# Patient Record
Sex: Female | Born: 1940
Health system: Southern US, Community
[De-identification: ages and names within clinical notes are randomized; demographics above are authoritative.]

## PROBLEM LIST (undated history)

## (undated) DIAGNOSIS — E785 Hyperlipidemia, unspecified: Secondary | ICD-10-CM

## (undated) DIAGNOSIS — R42 Dizziness and giddiness: Secondary | ICD-10-CM

## (undated) DIAGNOSIS — R600 Localized edema: Secondary | ICD-10-CM

## (undated) DIAGNOSIS — J189 Pneumonia, unspecified organism: Secondary | ICD-10-CM

## (undated) DIAGNOSIS — Z90710 Acquired absence of both cervix and uterus: Secondary | ICD-10-CM

## (undated) DIAGNOSIS — R001 Bradycardia, unspecified: Secondary | ICD-10-CM

## (undated) HISTORY — DX: Acquired absence of both cervix and uterus: Z90.710

## (undated) HISTORY — DX: Localized edema: R60.0

## (undated) HISTORY — DX: Bradycardia, unspecified: R00.1

## (undated) HISTORY — DX: Hyperlipidemia, unspecified: E78.5

## (undated) HISTORY — PX: ABDOMINAL HYSTERECTOMY: SHX81

## (undated) HISTORY — DX: Dizziness and giddiness: R42

---

## 1983-09-13 DIAGNOSIS — Z90710 Acquired absence of both cervix and uterus: Secondary | ICD-10-CM

## 1983-09-13 HISTORY — DX: Acquired absence of both cervix and uterus: Z90.710

## 1995-09-13 HISTORY — PX: OTHER SURGICAL HISTORY: SHX169

## 1998-08-30 ENCOUNTER — Emergency Department (HOSPITAL_COMMUNITY): Admission: EM | Admit: 1998-08-30 | Discharge: 1998-08-30 | Payer: Self-pay | Admitting: Emergency Medicine

## 1999-12-15 ENCOUNTER — Other Ambulatory Visit: Admission: RE | Admit: 1999-12-15 | Discharge: 1999-12-15 | Payer: Self-pay | Admitting: Obstetrics and Gynecology

## 2000-02-15 ENCOUNTER — Encounter: Payer: Self-pay | Admitting: Neurosurgery

## 2000-02-15 ENCOUNTER — Encounter: Admission: RE | Admit: 2000-02-15 | Discharge: 2000-02-15 | Payer: Self-pay | Admitting: Neurosurgery

## 2001-02-13 ENCOUNTER — Other Ambulatory Visit: Admission: RE | Admit: 2001-02-13 | Discharge: 2001-02-13 | Payer: Self-pay | Admitting: Obstetrics and Gynecology

## 2002-03-13 ENCOUNTER — Other Ambulatory Visit: Admission: RE | Admit: 2002-03-13 | Discharge: 2002-03-13 | Payer: Self-pay | Admitting: Internal Medicine

## 2007-03-05 ENCOUNTER — Ambulatory Visit: Admission: RE | Admit: 2007-03-05 | Discharge: 2007-03-05 | Payer: Self-pay | Admitting: Family Medicine

## 2009-07-02 ENCOUNTER — Ambulatory Visit (HOSPITAL_COMMUNITY): Admission: RE | Admit: 2009-07-02 | Discharge: 2009-07-02 | Payer: Self-pay | Admitting: Gastroenterology

## 2009-07-03 ENCOUNTER — Ambulatory Visit (HOSPITAL_COMMUNITY): Admission: RE | Admit: 2009-07-03 | Discharge: 2009-07-03 | Payer: Self-pay | Admitting: Gastroenterology

## 2010-02-16 ENCOUNTER — Ambulatory Visit (HOSPITAL_BASED_OUTPATIENT_CLINIC_OR_DEPARTMENT_OTHER): Admission: RE | Admit: 2010-02-16 | Discharge: 2010-02-16 | Payer: Self-pay | Admitting: Urology

## 2010-11-29 LAB — POCT I-STAT, CHEM 8
BUN: 21 mg/dL (ref 6–23)
Calcium, Ion: 1.24 mmol/L (ref 1.12–1.32)
Chloride: 105 mEq/L (ref 96–112)
Creatinine, Ser: 1.2 mg/dL (ref 0.4–1.2)
Glucose, Bld: 80 mg/dL (ref 70–99)
HCT: 40 % (ref 36.0–46.0)
Hemoglobin: 13.6 g/dL (ref 12.0–15.0)
Potassium: 3.4 mEq/L — ABNORMAL LOW (ref 3.5–5.1)
Sodium: 143 mEq/L (ref 135–145)
TCO2: 28 mmol/L (ref 0–100)

## 2011-01-04 ENCOUNTER — Telehealth: Payer: Self-pay | Admitting: Internal Medicine

## 2011-01-04 NOTE — Telephone Encounter (Signed)
Dr Teryl Lucy white office wants to know if you got records for dr bensimhon to review. Pt is waiting for an approval to be seen by dr bensimhon.

## 2011-01-04 NOTE — Telephone Encounter (Signed)
Have sent info to [facility] to sch appt

## 2011-01-05 NOTE — Telephone Encounter (Signed)
Pt is coming in tomorrow 4/26 to see Dr Bensimhon/jml

## 2011-01-06 ENCOUNTER — Encounter: Payer: Self-pay | Admitting: Internal Medicine

## 2011-01-06 ENCOUNTER — Ambulatory Visit (INDEPENDENT_AMBULATORY_CARE_PROVIDER_SITE_OTHER): Payer: Medicare Other | Admitting: Internal Medicine

## 2011-01-06 VITALS — BP 115/66 | HR 47 | Ht 65.0 in | Wt 160.0 lb

## 2011-01-06 DIAGNOSIS — R600 Localized edema: Secondary | ICD-10-CM | POA: Insufficient documentation

## 2011-01-06 DIAGNOSIS — R609 Edema, unspecified: Secondary | ICD-10-CM

## 2011-01-06 DIAGNOSIS — I498 Other specified cardiac arrhythmias: Secondary | ICD-10-CM

## 2011-01-06 DIAGNOSIS — R001 Bradycardia, unspecified: Secondary | ICD-10-CM

## 2011-01-06 DIAGNOSIS — R42 Dizziness and giddiness: Secondary | ICD-10-CM

## 2011-01-06 DIAGNOSIS — E785 Hyperlipidemia, unspecified: Secondary | ICD-10-CM | POA: Insufficient documentation

## 2011-01-06 NOTE — Assessment & Plan Note (Signed)
Likely due to sinus node dysfunction. Currently asymptomatic and with good HR response with walking. Will check echo and stress test which I suspect will be normal. We discussed the patho-physiology of this and I told her she will likely need a pacemaker down the road as this becomes symptomatic but will proceed with watchful for now. Knows to call me with progressive fatigue, presyncope or syncope. Given the fact that resting HR was in 50s last year suspect she will need pacer over the next year or two.

## 2011-01-06 NOTE — Patient Instructions (Signed)
Your physician has requested that you have an exercise tolerance test. For further information please visit [email or web address] Please also follow instruction sheet, as given. Your physician has requested that you have an echocardiogram. Echocardiography is a painless test that uses sound waves to create images of your heart. It provides your doctor with information about the size and shape of your heart and how well your heart's chambers and valves are working. This procedure takes approximately one hour. There are no restrictions for this procedure. Your physician wants you to follow-up in: 6 months. You will receive a reminder letter in the mail two months in advance. If you don't receive a letter, please call our office to schedule the follow-up appointment.

## 2011-01-06 NOTE — Progress Notes (Signed)
HPI:  Ashley Black is a 70 y/o woman (friend of Karen Chafe in our CCU) with a history of hyperlipidemia and LE edema referred by Dr. Laurann Montana for bradycardia.  Denies any h/o known heart disease. Has h/o mild chronic dizziness (greater than 20 years) which she relates to premarin. Never had a stress test or cath.   Does not exercise regularly but is very active without any problems. Says if her fluid builds up she gets SOB with steps but otherwise does great. No CP or pressure. Gets dizzy if she turns her head too fast. But no syncope or presyncope. Says HR in past was in 50s. Recently went to Dr. Cliffton Asters for routine check-up was 42 bpm. Had TSH was normal. Has lost a little weight recently through dieting Feels like her energy is better than most people. No ttaking any supplements or other meds that could cause bradycardia.   No family history of conduction disorders. Mother was obese and had DM2 and suffered from angina.    ROS: All other systems normal except as mentioned in HPI, past medical history and problem list.    No past medical history on file.  Current Outpatient Prescriptions  Medication Sig Dispense Refill  . loratadine (CLARITIN) 10 MG tablet Take 10 mg by mouth as needed.        . meloxicam (MOBIC) 7.5 MG tablet Take 7.5 mg by mouth daily.        . pravastatin (PRAVACHOL) 40 MG tablet Take 40 mg by mouth daily.        Marland Kitchen triamterene-hydrochlorothiazide (MAXZIDE-25) 37.5-25 MG per tablet Take 1 tablet by mouth daily.           Not on File  History   Social History  . Marital Status: Married    Spouse Name: N/A    Number of Children: N/A  . Years of Education: N/A   Occupational History  . Not on file.   Social History Main Topics  . Smoking status: Former Games developer  . Smokeless tobacco: Not on file  . Alcohol Use: Not on file  . Drug Use: Not on file  . Sexually Active: Not on file   Other Topics Concern  . Not on file   Social History Narrative  . No  narrative on file    No family history on file.  PHYSICAL EXAM: Filed Vitals:   01/06/11 1044  BP: 115/66  Pulse: 47   HR quickly 60-70 on slow hall walk. Up to 106 on brisk walk (70% of age-predicted max) General:  Well appearing. No respiratory difficulty. Looks younger than stated age HEENT: normal Neck: supple. no JVD. Carotids 2+ bilat; no bruits. No lymphadenopathy or thryomegaly appreciated. Cor: PMI nondisplaced. Brady,regular. No rubs, gallops or murmurs. Lungs: clear Abdomen: soft, nontender, nondistended. No hepatosplenomegaly. No bruits or masses. Good bowel sounds. Extremities: no cyanosis, clubbing, rash, edema Neuro: alert & oriented x 3, cranial nerves grossly intact. moves all 4 extremities w/o difficulty. Affect pleasant.  ECG:  Sinus brady 43 PR 144 QRS 68ms No ST-T wave abnormalities.    ASSESSMENT & PLAN:

## 2011-01-26 ENCOUNTER — Encounter: Payer: Self-pay | Admitting: Internal Medicine

## 2011-01-31 ENCOUNTER — Ambulatory Visit (HOSPITAL_COMMUNITY): Payer: Medicare Other | Attending: Internal Medicine

## 2011-01-31 ENCOUNTER — Ambulatory Visit (INDEPENDENT_AMBULATORY_CARE_PROVIDER_SITE_OTHER): Payer: Medicare Other | Admitting: Internal Medicine

## 2011-01-31 DIAGNOSIS — M7989 Other specified soft tissue disorders: Secondary | ICD-10-CM | POA: Insufficient documentation

## 2011-01-31 DIAGNOSIS — I379 Nonrheumatic pulmonary valve disorder, unspecified: Secondary | ICD-10-CM | POA: Insufficient documentation

## 2011-01-31 DIAGNOSIS — I495 Sick sinus syndrome: Secondary | ICD-10-CM | POA: Insufficient documentation

## 2011-01-31 DIAGNOSIS — I059 Rheumatic mitral valve disease, unspecified: Secondary | ICD-10-CM | POA: Insufficient documentation

## 2011-01-31 DIAGNOSIS — I498 Other specified cardiac arrhythmias: Secondary | ICD-10-CM

## 2011-01-31 DIAGNOSIS — E785 Hyperlipidemia, unspecified: Secondary | ICD-10-CM | POA: Insufficient documentation

## 2011-01-31 DIAGNOSIS — R001 Bradycardia, unspecified: Secondary | ICD-10-CM

## 2011-01-31 DIAGNOSIS — I079 Rheumatic tricuspid valve disease, unspecified: Secondary | ICD-10-CM | POA: Insufficient documentation

## 2011-01-31 NOTE — Progress Notes (Signed)
Addended by: Meredith Staggers on: 01/31/2011 03:35 PM   Modules accepted: Orders

## 2011-01-31 NOTE — Progress Notes (Signed)
Exercise Treadmill Test  Pre-Exercise Testing Evaluation Rhythm: sinus bradycardia  Rate: 52   PR:  .14 QRS:  .07  QT:  .44 QTc: .42     Test  Exercise Tolerance Test Ordering MD: Arvilla Meres, MD  Interpreting MD:  Arvilla Meres, MD  Unique Test No: 1  Treadmill:  1  Indication for ETT: Bradycardia  Contraindication to ETT: No   Stress Modality: exercise - treadmill  Cardiac Imaging Performed: non   Protocol: standard Bruce - maximal  Max BP:  187/74  Max MPHR (bpm):  149 85% MPR (bpm):  126  MPHR obtained (bpm):150 % MPHR obtained:  100  Reached 85% MPHR (min:sec):  5:58 Total Exercise Time (min-sec):  7:30  Workload in METS:  9.3 Borg Scale: 17  Reason ETT Terminated:  fatigue    ST Segment Analysis At Rest: Normal With Exercise: 1mm ST depression in inferior and lateral leads  Other Information Arrhythmia:  No Angina during ETT:  absent (0) Quality of ETT:  diagnostic  ETT Interpretation:  Normal HR response to exercise. Borderline positive ST depression on ECG with no exertional angina. Possible false + ECG but given family history will need Myoview or cardiac CT to evaluate further.   Comments: Normal HR response to exercise. Borderline positive ST depression on ECG with no exertional angina. Possible false + ECG but given family history will need Myoview or cardiac CT to evaluate further.    Recommendations: As above.

## 2011-02-10 ENCOUNTER — Encounter: Payer: Self-pay | Admitting: Internal Medicine

## 2011-02-14 ENCOUNTER — Other Ambulatory Visit (INDEPENDENT_AMBULATORY_CARE_PROVIDER_SITE_OTHER): Payer: Medicare Other | Admitting: *Deleted

## 2011-02-14 DIAGNOSIS — I498 Other specified cardiac arrhythmias: Secondary | ICD-10-CM

## 2011-02-14 DIAGNOSIS — R001 Bradycardia, unspecified: Secondary | ICD-10-CM

## 2011-02-14 LAB — BASIC METABOLIC PANEL
BUN: 22 mg/dL (ref 6–23)
Chloride: 107 mEq/L (ref 96–112)
GFR: 50.39 mL/min — ABNORMAL LOW (ref >60.00)
Potassium: 3.4 mEq/L — ABNORMAL LOW (ref 3.5–5.1)
Sodium: 141 mEq/L (ref 135–145)

## 2011-02-15 ENCOUNTER — Telehealth: Payer: Self-pay | Admitting: *Deleted

## 2011-02-15 DIAGNOSIS — E876 Hypokalemia: Secondary | ICD-10-CM

## 2011-02-15 NOTE — Telephone Encounter (Signed)
Notes Recorded by Jacqlyn Krauss, RN on 02/15/2011 at 2:08 PM Reviewed with Dr Graciela Husbands. Start KCL 10 mEq daily. Repeat BMP in 2 weeks. LMTCB     LMTCB

## 2011-02-15 NOTE — Telephone Encounter (Signed)
Forwarded to Triage.

## 2011-02-15 NOTE — Telephone Encounter (Signed)
BMP 02/14/11 (see lab results)  K 3.4 --reviewed with Dr Graciela Husbands. He recommended start KCL 10 mEq daily and repeat BMP in 2 weeks. LMTCB

## 2011-02-16 ENCOUNTER — Encounter: Payer: Self-pay | Admitting: *Deleted

## 2011-02-16 MED ORDER — POTASSIUM CHLORIDE ER 10 MEQ PO TBCR: EXTENDED_RELEASE_TABLET | ORAL | Status: DC

## 2011-02-16 NOTE — Telephone Encounter (Signed)
Addended by: Harriet Butte on: 02/16/2011 09:05 AM   Modules accepted: Orders

## 2011-02-16 NOTE — Telephone Encounter (Signed)
Patient aware to take Potassium 10 MEQ one tablet by mouth daily. A prescription send to [facility]. Pt has an appointment to repeat BMET on June 22 Nd 2012.

## 2011-02-17 ENCOUNTER — Telehealth: Payer: Self-pay | Admitting: Cardiology

## 2011-02-17 NOTE — Telephone Encounter (Signed)
Pt calling about her lab results which were reviewed with her.  She was concerned about her kidney function and was notified that the lab work demonstrates normal function.  Instructed her that her potassium was low and that she needed a suppliment to which she responded she was aware and has started that.

## 2011-02-17 NOTE — Telephone Encounter (Signed)
Pt rtn call to [facility] about lft results

## 2011-02-23 ENCOUNTER — Ambulatory Visit (HOSPITAL_COMMUNITY)
Admission: RE | Admit: 2011-02-23 | Discharge: 2011-02-23 | Disposition: A | Discharge status: Home / Self Care | Payer: Medicare Other | Source: Ambulatory Visit | Attending: Internal Medicine | Admitting: Internal Medicine

## 2011-02-23 DIAGNOSIS — I498 Other specified cardiac arrhythmias: Secondary | ICD-10-CM | POA: Insufficient documentation

## 2011-02-23 DIAGNOSIS — R001 Bradycardia, unspecified: Secondary | ICD-10-CM

## 2011-02-23 DIAGNOSIS — K8020 Calculus of gallbladder without cholecystitis without obstruction: Secondary | ICD-10-CM | POA: Insufficient documentation

## 2011-02-23 MED ORDER — IOHEXOL 350 MG/ML SOLN
80.0000 mL | Freq: Once | INTRAVENOUS | Status: AC | PRN
  Administered 2011-02-23: 80 mL via INTRAVENOUS

## 2011-02-24 DIAGNOSIS — R079 Chest pain, unspecified: Secondary | ICD-10-CM

## 2011-02-28 ENCOUNTER — Telehealth: Payer: Self-pay | Admitting: Internal Medicine

## 2011-02-28 NOTE — Telephone Encounter (Signed)
Pt calling re cardiac ct done 6-13 wants results

## 2011-03-01 NOTE — Telephone Encounter (Signed)
Pt given CT results.

## 2011-03-04 ENCOUNTER — Other Ambulatory Visit (INDEPENDENT_AMBULATORY_CARE_PROVIDER_SITE_OTHER): Payer: Medicare Other | Admitting: *Deleted

## 2011-03-04 DIAGNOSIS — E876 Hypokalemia: Secondary | ICD-10-CM

## 2011-03-04 LAB — BASIC METABOLIC PANEL
BUN: 20 mg/dL (ref 6–23)
Chloride: 110 mEq/L (ref 96–112)
GFR: 48.88 mL/min — ABNORMAL LOW (ref >60.00)
Potassium: 3.7 mEq/L (ref 3.5–5.1)
Sodium: 143 mEq/L (ref 135–145)

## 2011-03-07 ENCOUNTER — Telehealth: Payer: Self-pay | Admitting: Internal Medicine

## 2011-03-07 NOTE — Telephone Encounter (Signed)
Pt given lab results 

## 2011-03-07 NOTE — Telephone Encounter (Signed)
Pt rtn call to nurse to get lab results

## 2011-09-21 ENCOUNTER — Other Ambulatory Visit: Payer: Self-pay | Admitting: *Deleted

## 2011-09-21 DIAGNOSIS — E876 Hypokalemia: Secondary | ICD-10-CM

## 2011-09-21 MED ORDER — POTASSIUM CHLORIDE ER 10 MEQ PO TBCR: EXTENDED_RELEASE_TABLET | ORAL | Status: DC

## 2011-12-12 ENCOUNTER — Other Ambulatory Visit: Payer: Self-pay | Admitting: *Deleted

## 2011-12-12 DIAGNOSIS — E876 Hypokalemia: Secondary | ICD-10-CM

## 2011-12-12 MED ORDER — POTASSIUM CHLORIDE ER 10 MEQ PO TBCR: EXTENDED_RELEASE_TABLET | ORAL | Status: DC

## 2012-11-30 ENCOUNTER — Ambulatory Visit
Admission: RE | Admit: 2012-11-30 | Discharge: 2012-11-30 | Disposition: A | Discharge status: Home / Self Care | Payer: Medicare Other | Source: Ambulatory Visit | Attending: Family Medicine | Admitting: Family Medicine

## 2012-11-30 ENCOUNTER — Other Ambulatory Visit: Payer: Self-pay | Admitting: Family Medicine

## 2012-11-30 DIAGNOSIS — J189 Pneumonia, unspecified organism: Secondary | ICD-10-CM

## 2015-02-20 ENCOUNTER — Other Ambulatory Visit: Payer: Self-pay | Admitting: Family Medicine

## 2015-02-20 ENCOUNTER — Ambulatory Visit
Admission: RE | Admit: 2015-02-20 | Discharge: 2015-02-20 | Disposition: A | Discharge status: Home / Self Care | Payer: Medicare Other | Source: Ambulatory Visit | Attending: Family Medicine | Admitting: Family Medicine

## 2015-02-20 DIAGNOSIS — J189 Pneumonia, unspecified organism: Secondary | ICD-10-CM

## 2016-05-13 ENCOUNTER — Other Ambulatory Visit: Payer: Self-pay | Admitting: Gastroenterology

## 2016-06-17 ENCOUNTER — Encounter (HOSPITAL_COMMUNITY): Payer: Self-pay | Admitting: *Deleted

## 2016-06-21 ENCOUNTER — Ambulatory Visit (HOSPITAL_COMMUNITY): Payer: Medicare Other | Admitting: Anesthesiology

## 2016-06-21 ENCOUNTER — Encounter (HOSPITAL_COMMUNITY)
Admission: RE | Disposition: A | Discharge status: Home / Self Care | Payer: Self-pay | Source: Ambulatory Visit | Attending: Gastroenterology

## 2016-06-21 ENCOUNTER — Encounter (HOSPITAL_COMMUNITY): Payer: Self-pay

## 2016-06-21 ENCOUNTER — Ambulatory Visit (HOSPITAL_COMMUNITY)
Admission: RE | Admit: 2016-06-21 | Discharge: 2016-06-21 | Disposition: A | Discharge status: Home / Self Care | Payer: Medicare Other | Source: Ambulatory Visit | Attending: Gastroenterology | Admitting: Gastroenterology

## 2016-06-21 DIAGNOSIS — E7800 Pure hypercholesterolemia, unspecified: Secondary | ICD-10-CM | POA: Insufficient documentation

## 2016-06-21 DIAGNOSIS — D128 Benign neoplasm of rectum: Secondary | ICD-10-CM | POA: Insufficient documentation

## 2016-06-21 DIAGNOSIS — Z1211 Encounter for screening for malignant neoplasm of colon: Secondary | ICD-10-CM | POA: Diagnosis present

## 2016-06-21 DIAGNOSIS — Z86010 Personal history of colonic polyps: Secondary | ICD-10-CM | POA: Insufficient documentation

## 2016-06-21 DIAGNOSIS — Z87891 Personal history of nicotine dependence: Secondary | ICD-10-CM | POA: Diagnosis not present

## 2016-06-21 DIAGNOSIS — N183 Chronic kidney disease, stage 3 (moderate): Secondary | ICD-10-CM | POA: Insufficient documentation

## 2016-06-21 HISTORY — PX: FLEXIBLE SIGMOIDOSCOPY: SHX5431

## 2016-06-21 HISTORY — DX: Pneumonia, unspecified organism: J18.9

## 2016-06-21 SURGERY — SIGMOIDOSCOPY, FLEXIBLE
Anesthesia: Monitor Anesthesia Care

## 2016-06-21 MED ORDER — PROPOFOL 10 MG/ML IV BOLUS
INTRAVENOUS | Status: DC | PRN
  Administered 2016-06-21 (×3): 10 mg via INTRAVENOUS
  Administered 2016-06-21 (×2): 20 mg via INTRAVENOUS

## 2016-06-21 MED ORDER — ONDANSETRON HCL 4 MG/2ML IJ SOLN
INTRAMUSCULAR | Status: DC | PRN
  Administered 2016-06-21: 4 mg via INTRAVENOUS

## 2016-06-21 MED ORDER — PROPOFOL 10 MG/ML IV BOLUS
INTRAVENOUS | Status: AC
  Filled 2016-06-21: qty 40

## 2016-06-21 MED ORDER — PROPOFOL 500 MG/50ML IV EMUL
INTRAVENOUS | Status: DC | PRN
  Administered 2016-06-21: 200 ug/kg/min via INTRAVENOUS

## 2016-06-21 MED ORDER — LACTATED RINGERS IV SOLN
INTRAVENOUS | Status: DC
  Administered 2016-06-21: 13:00:00 via INTRAVENOUS

## 2016-06-21 MED ORDER — SODIUM CHLORIDE 0.9 % IV SOLN: INTRAVENOUS | Status: DC

## 2016-06-21 MED ORDER — ONDANSETRON HCL 4 MG/2ML IJ SOLN
INTRAMUSCULAR | Status: AC
  Filled 2016-06-21: qty 2

## 2016-06-21 MED ORDER — PROPOFOL 10 MG/ML IV BOLUS
INTRAVENOUS | Status: AC
  Filled 2016-06-21: qty 20

## 2016-06-21 SURGICAL SUPPLY — 22 items

## 2016-06-21 NOTE — Anesthesia Preprocedure Evaluation (Addendum)
Anesthesia Evaluation  Patient identified by MRN, date of birth, ID band Patient awake    Reviewed: Allergy & Precautions, NPO status , Patient's Chart, lab work & pertinent test results  Airway Mallampati: II  TM Distance: >3 FB Neck ROM: Full    Dental   Bridgework:   Pulmonary pneumonia, resolved, former smoker,    Pulmonary exam normal breath sounds clear to auscultation       Cardiovascular negative cardio ROS Normal cardiovascular exam Rhythm:Regular Rate:Normal     Neuro/Psych negative neurological ROS  negative psych ROS   GI/Hepatic negative GI ROS, Neg liver ROS,   Endo/Other  negative endocrine ROS  Renal/GU negative Renal ROS  negative genitourinary   Musculoskeletal negative musculoskeletal ROS (+)   Abdominal   Peds negative pediatric ROS (+)  Hematology negative hematology ROS (+)   Anesthesia Other Findings   Reproductive/Obstetrics negative OB ROS                            Anesthesia Physical Anesthesia Plan  ASA: II  Anesthesia Plan: MAC   Post-op Pain Management:    Induction: Intravenous  Airway Management Planned: Natural Airway  Additional Equipment:   Intra-op Plan:   Post-operative Plan:   Informed Consent: I have reviewed the patients History and Physical, chart, labs and discussed the procedure including the risks, benefits and alternatives for the proposed anesthesia with the patient or authorized representative who has indicated his/her understanding and acceptance.     Plan Discussed with: CRNA  Anesthesia Plan Comments:         Anesthesia Quick Evaluation

## 2016-06-21 NOTE — Anesthesia Postprocedure Evaluation (Signed)
Anesthesia Post Note  Patient: Ashley Black  Procedure(s) Performed: Procedure(s) (LRB): FLEXIBLE SIGMOIDOSCOPY (N/A)  Patient location during evaluation: PACU Anesthesia Type: MAC Level of consciousness: awake and alert Pain management: pain level controlled Vital Signs Assessment: post-procedure vital signs reviewed and stable Respiratory status: spontaneous breathing, nonlabored ventilation, respiratory function stable and patient connected to nasal cannula oxygen Cardiovascular status: stable and blood pressure returned to baseline Anesthetic complications: no    Last Vitals:  Vitals:   06/21/16 1221 06/21/16 1402  BP: (!) 133/56 (!) 146/65  Pulse:  75  Resp:  (!) 24  Temp: 36.8 C 36.4 C    Last Pain:  Vitals:   06/21/16 1402  TempSrc: Oral                 Tyric Rodeheaver J

## 2016-06-21 NOTE — Transfer of Care (Signed)
Immediate Anesthesia Transfer of Care Note  Patient: Ashley Black  Procedure(s) Performed: Procedure(s): FLEXIBLE SIGMOIDOSCOPY (N/A)  Patient Location: PACU  Anesthesia Type:MAC  Level of Consciousness:  sedated, patient cooperative and responds to stimulation  Airway & Oxygen Therapy:Patient Spontanous Breathing and Patient connected to face mask oxgen  Post-op Assessment:  Report given to PACU RN and Post -op Vital signs reviewed and stable  Post vital signs:  Reviewed and stable  Last Vitals:  Vitals:   06/21/16 1221  BP: (!) 133/56  Temp: [id number] C    Complications: No apparent anesthesia complications

## 2016-06-21 NOTE — Op Note (Signed)
[facility] Patient Name: Ashley Black Procedure Date: 06/21/2016 MRN: [id number] Attending MD: Garlan Fair , MD Date of Birth: 1941/03/30 CSN: [id number] Age: 75 Admit Type: Outpatient Procedure:                Colonoscopy Indications:              High risk colon cancer surveillance: Personal                            history of adenoma with villous component Providers:                Garlan Fair, MD, Cleda Daub, RN, William Dalton, Technician Referring MD:              Medicines:                Propofol per Anesthesia Complications:            No immediate complications. Estimated Blood Loss:     Estimated blood loss: none. Procedure:                Pre-Anesthesia Assessment:                           - Prior to the procedure, a History and Physical                            was performed, and patient medications and                            allergies were reviewed. The patient's tolerance of                            previous anesthesia was also reviewed. The risks                            and benefits of the procedure and the sedation                            options and risks were discussed with the patient.                            All questions were answered, and informed consent                            was obtained. Prior Anticoagulants: The patient has                            taken no previous anticoagulant or antiplatelet                            agents. ASA Grade Assessment: II - A patient with  mild systemic disease. After reviewing the risks                            and benefits, the patient was deemed in                            satisfactory condition to undergo the procedure.                           After obtaining informed consent, the colonoscope                            was passed under direct vision. Throughout the                            procedure,  the patient's blood pressure, pulse, and                            oxygen saturations were monitored continuously. The                            [id number]) scope was introduced through                            the anus and advanced to the the descending colon.                            The colonoscopy was technically difficult and                            complex due to restricted mobility of the colon.                            The patient tolerated the procedure well. The                            quality of the bowel preparation was good. No                            anatomical landmarks were photographed. Findings:      The perianal and digital rectal examinations were normal.      A 10 mm polyp was found in the rectum. The polyp was sessile. The polyp       was removed with a hot snare. Resection and retrieval were complete.      The exam was otherwise without abnormality. Impression:               - One 10 mm polyp in the rectum, removed with a hot                            snare. Resected and retrieved.                           - The examination was otherwise normal. Moderate Sedation:  N/A- Per Anesthesia Care Recommendation:           - Patient has a contact number available for                            emergencies. The signs and symptoms of potential                            delayed complications were discussed with the                            patient. Return to normal activities tomorrow.                            Written discharge instructions were provided to the                            patient.                           - Return to primary care physician PRN. Schedule CT                            colonography.                           - Resume previous diet.                           - Continue present medications.                           - Perform a virtual colonoscopy at appointment to                            be  scheduled. Procedure Code(s):        --- Professional ---                           [phone], 52, Colonoscopy, flexible; with removal of                            tumor(s), polyp(s), or other lesion(s) by snare                            technique Diagnosis Code(s):        --- Professional ---                           Z86.010, Personal history of colonic polyps                           K62.1, Rectal polyp CPT copyright 2016 [employer]. All rights reserved. The codes documented in this report are preliminary and upon coder review may  be revised to meet current compliance requirements. Earle Gell, MD Garlan Fair, MD 06/21/2016 2:03:47 PM This report has been signed electronically. Number of  Addenda: 0

## 2016-06-21 NOTE — H&P (Signed)
Procedure: Surveillance colonoscopy. 06/28/2006 colonoscopy was performed with removal of a 1 cm tubulovillous adenomatous rectal polyp. In 2010, normal incomplete surveillance colonoscopy followed by air contrast barium enema were performed.  History: The patient is a 75 year old female born [birth year]. She is scheduled to undergo a surveillance colonoscopy today.  Past medical history: Hypercholesterolemia. Interstitial cystitis. Stage III chronic kidney disease. Sinus node dysfunction leading to bradycardia. Hysterectomy. Anterior cervical spine fusion. D&C. Right eye cataract surgery.  Exam: The patient is alert and lying comfortably on the endoscopy stretcher. Abdomen is soft and nontender to palpation. Lungs are clear to auscultation. Cardiac exam reveals a regular rhythm.  Plan: Proceed with surveillance colonoscopy

## 2016-06-21 NOTE — Discharge Instructions (Signed)

## 2016-06-22 ENCOUNTER — Encounter (HOSPITAL_COMMUNITY): Payer: Self-pay | Admitting: Gastroenterology

## 2016-08-15 ENCOUNTER — Other Ambulatory Visit: Payer: Self-pay | Admitting: Gastroenterology

## 2016-08-15 DIAGNOSIS — Z1211 Encounter for screening for malignant neoplasm of colon: Secondary | ICD-10-CM

## 2016-09-23 ENCOUNTER — Ambulatory Visit
Admission: RE | Admit: 2016-09-23 | Discharge: 2016-09-23 | Disposition: A | Discharge status: Home / Self Care | Payer: Medicare Other | Source: Ambulatory Visit | Attending: Gastroenterology | Admitting: Gastroenterology

## 2016-09-23 DIAGNOSIS — Z1211 Encounter for screening for malignant neoplasm of colon: Secondary | ICD-10-CM

## 2017-02-22 ENCOUNTER — Other Ambulatory Visit: Payer: Self-pay | Admitting: Family Medicine

## 2017-02-22 DIAGNOSIS — R1011 Right upper quadrant pain: Secondary | ICD-10-CM

## 2017-02-23 ENCOUNTER — Ambulatory Visit
Admission: RE | Admit: 2017-02-23 | Discharge: 2017-02-23 | Disposition: A | Discharge status: Home / Self Care | Payer: Medicare Other | Source: Ambulatory Visit | Attending: Family Medicine | Admitting: Family Medicine

## 2017-02-23 DIAGNOSIS — R1011 Right upper quadrant pain: Secondary | ICD-10-CM

## 2017-06-08 ENCOUNTER — Other Ambulatory Visit: Payer: Self-pay | Admitting: Family Medicine

## 2017-06-08 DIAGNOSIS — H93A3 Pulsatile tinnitus, bilateral: Secondary | ICD-10-CM

## 2017-06-12 ENCOUNTER — Ambulatory Visit
Admission: RE | Admit: 2017-06-12 | Discharge: 2017-06-12 | Disposition: A | Discharge status: Home / Self Care | Payer: Medicare Other | Source: Ambulatory Visit | Attending: Family Medicine | Admitting: Family Medicine

## 2017-06-12 DIAGNOSIS — H93A3 Pulsatile tinnitus, bilateral: Secondary | ICD-10-CM

## 2018-08-16 IMAGING — US US CAROTID DUPLEX BILAT
1 series · 13 of 24 positions shown · non-contrast
Comparison: None.

CLINICAL DATA: Bilateral pulsatile tinnitus for 6 months.

EXAM:
BILATERAL CAROTID DUPLEX ULTRASOUND
TECHNIQUE: Gray scale imaging, color Doppler and duplex ultrasound were
performed of bilateral carotid and vertebral arteries in the neck.

[Series 1: us carotid duplex bilat · 0.06mm/px · 13 of 45 slices shown]
[im 1/45]
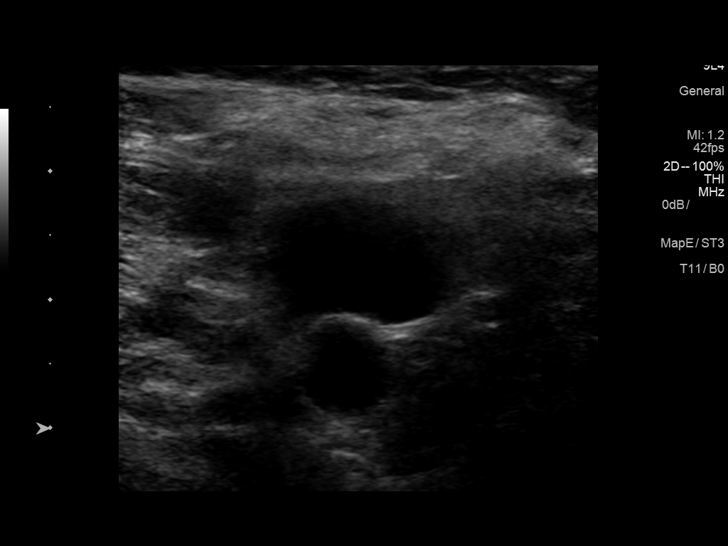
[im 4/45]
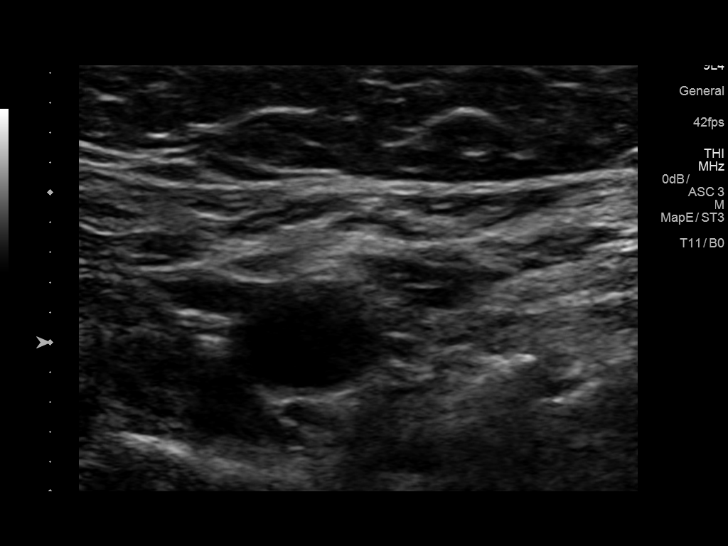
[im 8/45]
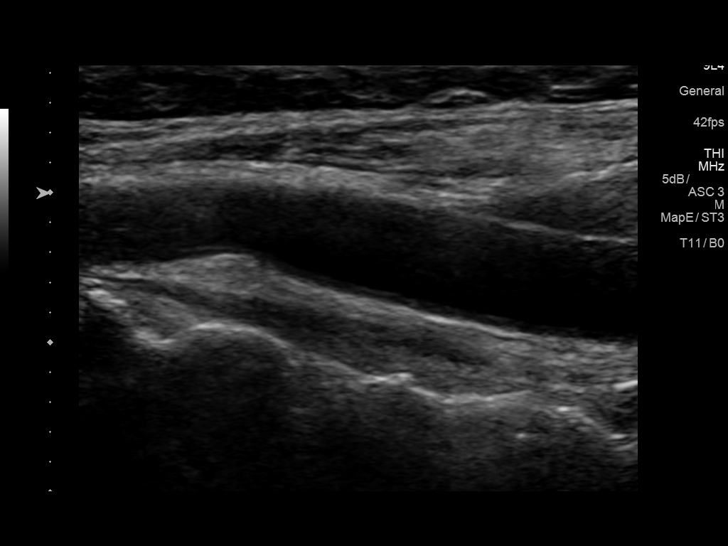
[im 12/45]
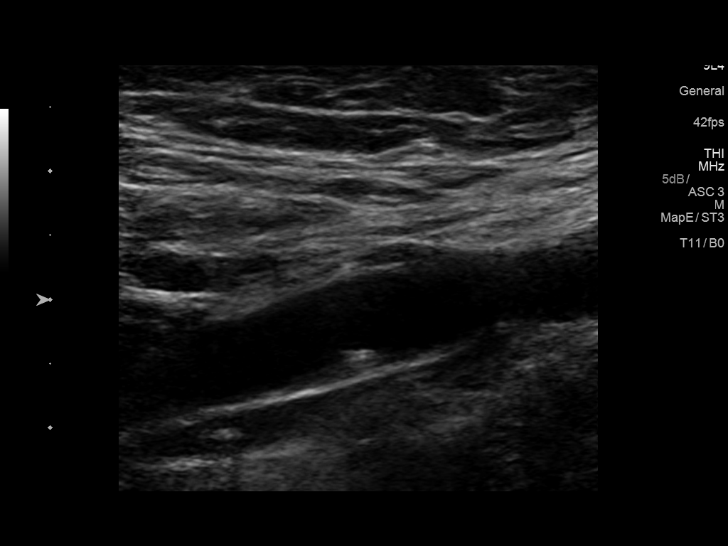
[im 16/45]
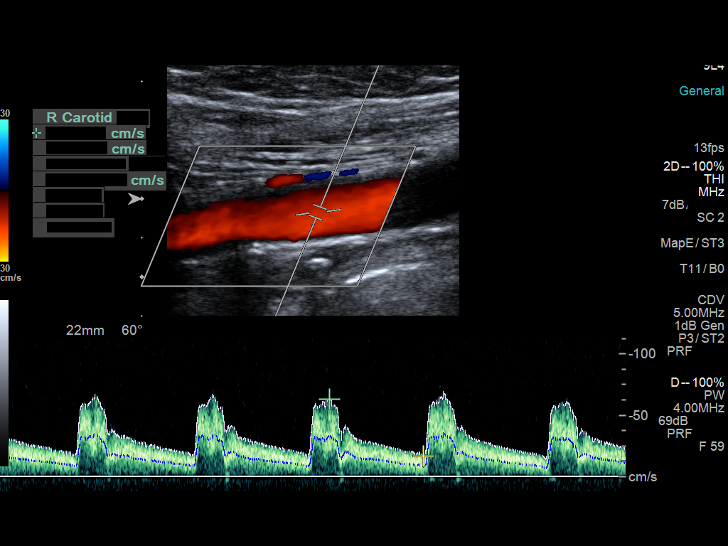
[im 20/45]
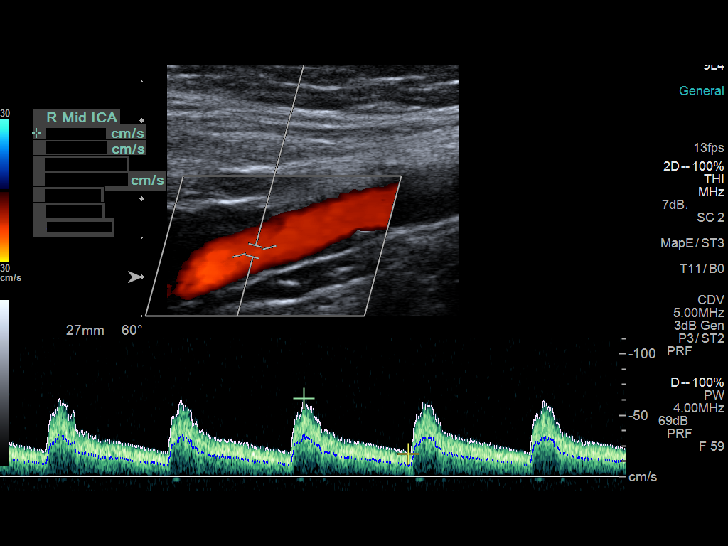
[im 23/45]
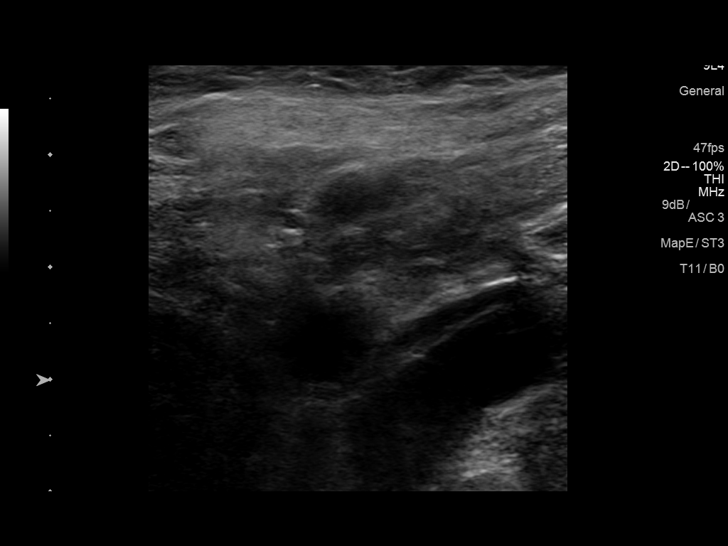
[im 25/45]
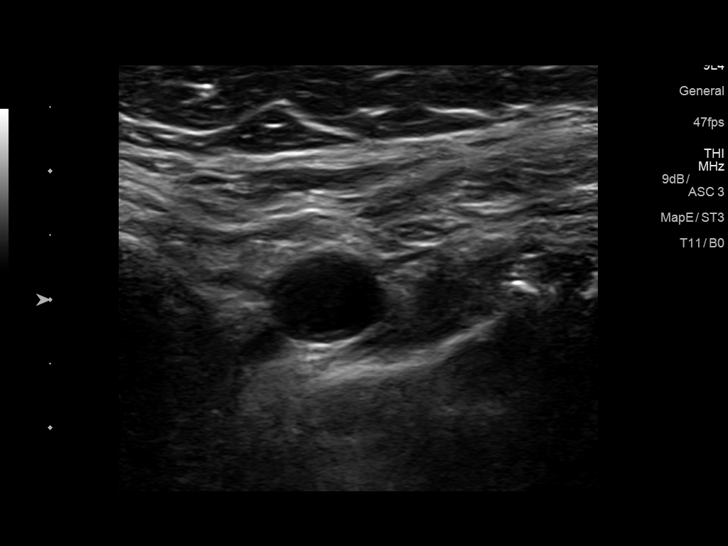
[im 29/45]
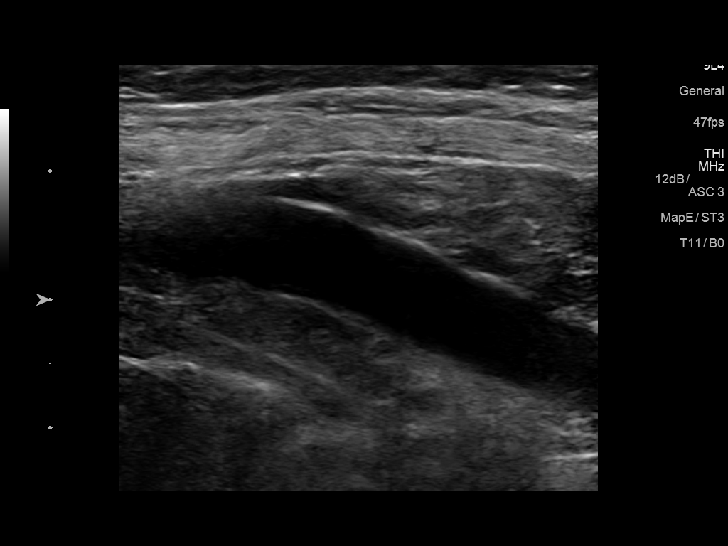
[im 33/45]
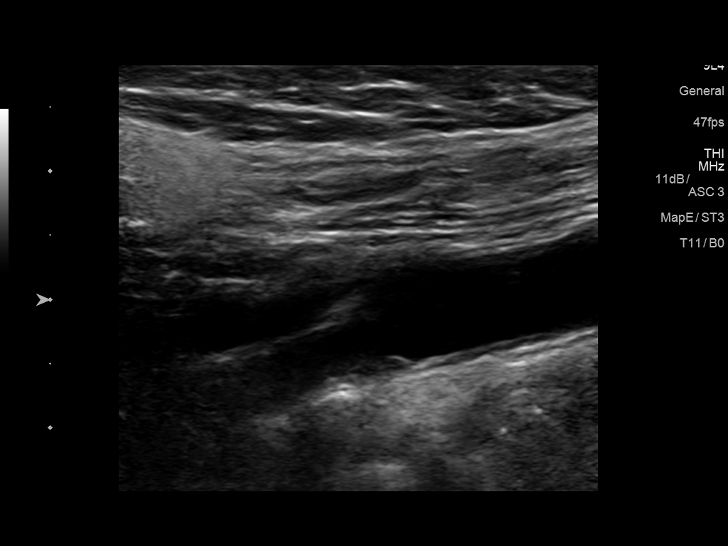
[im 37/45]
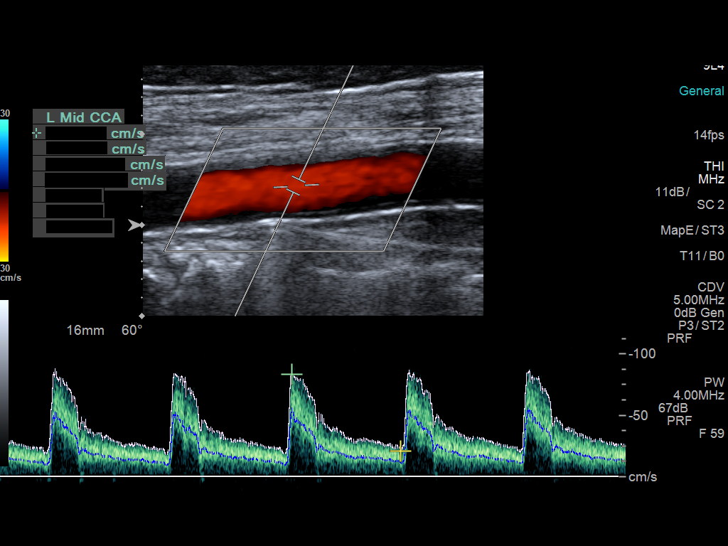
[im 41/45]
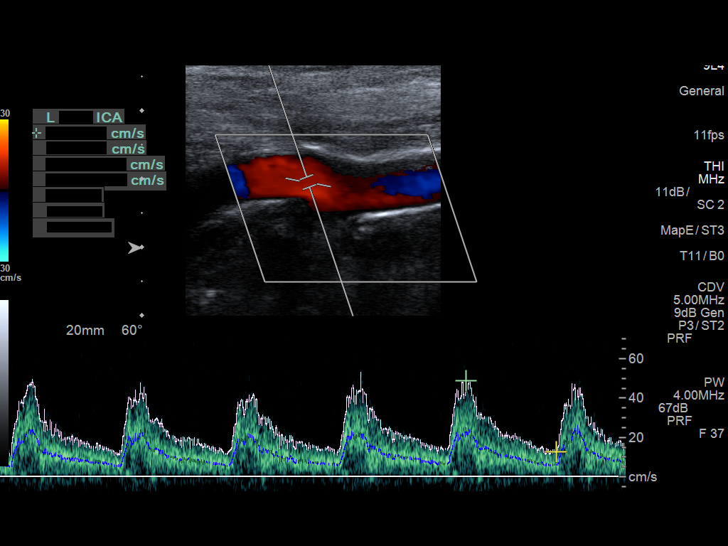
[im 45/45]
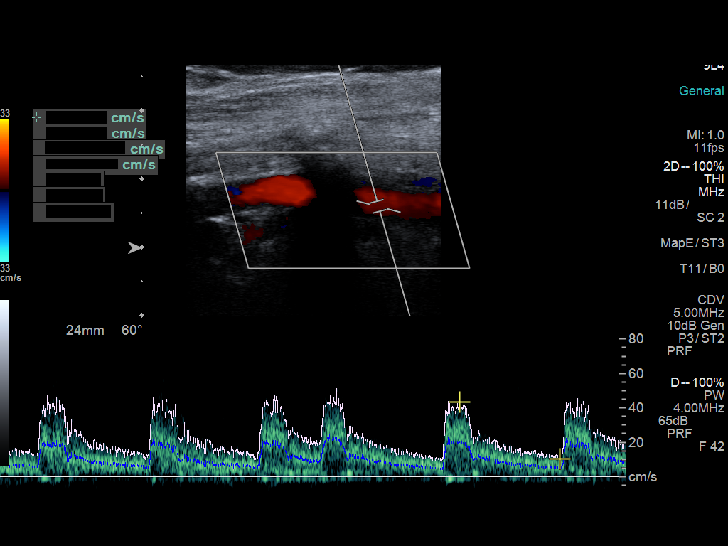

[13 of 24 positions shown; findings below may reference images not displayed]

FINDINGS: Criteria: Quantification of carotid stenosis is based on velocity
parameters that correlate the residual internal carotid diameter
with NASCET-based stenosis levels, using the diameter of the distal
internal carotid lumen as the denominator for stenosis measurement.

The following velocity measurements were obtained:

RIGHT

ICA:  64 cm/sec

CCA:  131 cm/sec

SYSTOLIC ICA/CCA RATIO:

DIASTOLIC ICA/CCA RATIO:

ECA:  65 cm/sec

LEFT

ICA:  66 cm/sec

CCA:  83 cm/sec

SYSTOLIC ICA/CCA RATIO:

DIASTOLIC ICA/CCA RATIO:

ECA:  46 cm/sec

RIGHT CAROTID ARTERY: Small amount of plaque at the right carotid
bulb. External carotid artery is patent with normal waveform. Normal
waveforms and velocities in the internal carotid artery.

RIGHT VERTEBRAL ARTERY: Antegrade flow and normal waveform in the
right vertebral artery.

LEFT CAROTID ARTERY: Small amount of plaque at the left carotid bulb
and proximal internal carotid artery. External carotid artery is
patent with normal waveform. Normal waveforms and velocities in the
internal carotid artery.

LEFT VERTEBRAL ARTERY: Antegrade flow and normal waveform in the
left vertebral artery.
IMPRESSION: Mild atherosclerotic disease in bilateral carotid arteries.
Estimated degree of stenosis in the internal carotid arteries is
less than 50% bilaterally.

Patent vertebral arteries with antegrade flow.

## 2020-01-24 ENCOUNTER — Encounter: Payer: Self-pay | Admitting: Neurology

## 2020-01-31 ENCOUNTER — Encounter: Payer: Self-pay | Admitting: Neurology

## 2020-04-20 ENCOUNTER — Other Ambulatory Visit: Payer: Self-pay

## 2020-04-20 ENCOUNTER — Encounter (HOSPITAL_COMMUNITY): Payer: Self-pay

## 2020-04-20 ENCOUNTER — Inpatient Hospital Stay (HOSPITAL_COMMUNITY)
Admission: EM | Admit: 2020-04-20 | Discharge: 2020-04-25 | DRG: 178 | Disposition: A | Discharge status: Home Health | Payer: Medicare Other | Attending: Internal Medicine | Admitting: Internal Medicine

## 2020-04-20 ENCOUNTER — Emergency Department (HOSPITAL_COMMUNITY): Payer: Medicare Other

## 2020-04-20 DIAGNOSIS — U071 COVID-19: Principal | ICD-10-CM | POA: Diagnosis present

## 2020-04-20 DIAGNOSIS — Z87891 Personal history of nicotine dependence: Secondary | ICD-10-CM

## 2020-04-20 DIAGNOSIS — E876 Hypokalemia: Secondary | ICD-10-CM | POA: Diagnosis not present

## 2020-04-20 DIAGNOSIS — R001 Bradycardia, unspecified: Secondary | ICD-10-CM | POA: Diagnosis present

## 2020-04-20 DIAGNOSIS — E785 Hyperlipidemia, unspecified: Secondary | ICD-10-CM | POA: Diagnosis present

## 2020-04-20 DIAGNOSIS — Z79899 Other long term (current) drug therapy: Secondary | ICD-10-CM

## 2020-04-20 DIAGNOSIS — E872 Acidosis: Secondary | ICD-10-CM | POA: Diagnosis present

## 2020-04-20 DIAGNOSIS — I10 Essential (primary) hypertension: Secondary | ICD-10-CM | POA: Diagnosis present

## 2020-04-20 DIAGNOSIS — N390 Urinary tract infection, site not specified: Secondary | ICD-10-CM | POA: Diagnosis present

## 2020-04-20 DIAGNOSIS — E860 Dehydration: Secondary | ICD-10-CM | POA: Diagnosis present

## 2020-04-20 DIAGNOSIS — Z8249 Family history of ischemic heart disease and other diseases of the circulatory system: Secondary | ICD-10-CM

## 2020-04-20 DIAGNOSIS — Z90710 Acquired absence of both cervix and uterus: Secondary | ICD-10-CM

## 2020-04-20 DIAGNOSIS — I9589 Other hypotension: Secondary | ICD-10-CM

## 2020-04-20 DIAGNOSIS — N3000 Acute cystitis without hematuria: Secondary | ICD-10-CM

## 2020-04-20 DIAGNOSIS — A0839 Other viral enteritis: Secondary | ICD-10-CM | POA: Diagnosis present

## 2020-04-20 DIAGNOSIS — E861 Hypovolemia: Secondary | ICD-10-CM

## 2020-04-20 LAB — CBC
HCT: 41.5 % (ref 36.0–46.0)
Hemoglobin: 14 g/dL (ref 12.0–15.0)
MCH: 28.1 pg (ref 26.0–34.0)
MCHC: 33.7 g/dL (ref 30.0–36.0)
MCV: 83.2 fL (ref 80.0–100.0)
Platelets: 148 10*3/uL — ABNORMAL LOW (ref 150–400)
RBC: 4.99 MIL/uL (ref 3.87–5.11)
RDW: 11.9 % (ref 11.5–15.5)
WBC: 3.8 10*3/uL — ABNORMAL LOW (ref 4.0–10.5)
nRBC: 0 % (ref 0.0–0.2)

## 2020-04-20 LAB — BASIC METABOLIC PANEL
Anion gap: 12 (ref 5–15)
BUN: 11 mg/dL (ref 8–23)
CO2: 21 mmol/L — ABNORMAL LOW (ref 22–32)
Calcium: 8.5 mg/dL — ABNORMAL LOW (ref 8.9–10.3)
Chloride: 101 mmol/L (ref 98–111)
Creatinine, Ser: 0.92 mg/dL (ref 0.44–1.00)
GFR calc Af Amer: >60 mL/min (ref >60)
GFR calc non Af Amer: 59 mL/min — ABNORMAL LOW (ref >60)
Glucose, Bld: 107 mg/dL — ABNORMAL HIGH (ref 70–99)
Potassium: 2.6 mmol/L — CL (ref 3.5–5.1)
Sodium: 134 mmol/L — ABNORMAL LOW (ref 135–145)

## 2020-04-20 LAB — URINALYSIS, ROUTINE W REFLEX MICROSCOPIC
Bilirubin Urine: NEGATIVE
Glucose, UA: NEGATIVE mg/dL
Ketones, ur: NEGATIVE mg/dL
Nitrite: POSITIVE — AB
Protein, ur: NEGATIVE mg/dL
Specific Gravity, Urine: 1.011 (ref 1.005–1.030)
pH: 5 (ref 5.0–8.0)

## 2020-04-20 LAB — HEPATIC FUNCTION PANEL
ALT: 19 U/L (ref 0–44)
AST: 28 U/L (ref 15–41)
Albumin: 3.6 g/dL (ref 3.5–5.0)
Alkaline Phosphatase: 66 U/L (ref 38–126)
Bilirubin, Direct: 0.3 mg/dL — ABNORMAL HIGH (ref 0.0–0.2)
Indirect Bilirubin: 0.8 mg/dL (ref 0.3–0.9)
Total Bilirubin: 1.1 mg/dL (ref 0.3–1.2)
Total Protein: 7.2 g/dL (ref 6.5–8.1)

## 2020-04-20 LAB — SARS CORONAVIRUS 2 BY RT PCR (HOSPITAL ORDER, PERFORMED IN ~~LOC~~ HOSPITAL LAB): SARS Coronavirus 2: POSITIVE — AB

## 2020-04-20 LAB — MAGNESIUM: Magnesium: 1.8 mg/dL (ref 1.7–2.4)

## 2020-04-20 LAB — LACTATE DEHYDROGENASE: LDH: 247 U/L — ABNORMAL HIGH (ref 98–192)

## 2020-04-20 LAB — FIBRINOGEN: Fibrinogen: 504 mg/dL — ABNORMAL HIGH (ref 210–475)

## 2020-04-20 LAB — LACTIC ACID, PLASMA: Lactic Acid, Venous: 2.2 mmol/L (ref 0.5–1.9)

## 2020-04-20 LAB — TROPONIN I (HIGH SENSITIVITY): Troponin I (High Sensitivity): 23 ng/L — ABNORMAL HIGH (ref <18)

## 2020-04-20 LAB — D-DIMER, QUANTITATIVE: D-Dimer, Quant: 0.47 ug/mL-FEU (ref 0.00–0.50)

## 2020-04-20 LAB — C-REACTIVE PROTEIN: CRP: 1.7 mg/dL — ABNORMAL HIGH (ref <1.0)

## 2020-04-20 LAB — PROCALCITONIN: Procalcitonin: 1.48 ng/mL

## 2020-04-20 LAB — TRIGLYCERIDES: Triglycerides: 146 mg/dL (ref <150)

## 2020-04-20 LAB — FERRITIN: Ferritin: 290 ng/mL (ref 11–307)

## 2020-04-20 MED ORDER — ONDANSETRON HCL 4 MG PO TABS: 4.0000 mg | ORAL_TABLET | Freq: Four times a day (QID) | ORAL | Status: DC | PRN

## 2020-04-20 MED ORDER — ACETAMINOPHEN 325 MG PO TABS: 650.0000 mg | ORAL_TABLET | Freq: Four times a day (QID) | ORAL | Status: DC | PRN

## 2020-04-20 MED ORDER — SODIUM CHLORIDE 0.9 % IV SOLN: INTRAVENOUS | Status: DC

## 2020-04-20 MED ORDER — SODIUM CHLORIDE 0.9 % IV SOLN
1.0000 g | INTRAVENOUS | Status: AC
  Administered 2020-04-21 – 2020-04-22 (×2): 1 g via INTRAVENOUS
  Filled 2020-04-20 (×2): qty 10

## 2020-04-20 MED ORDER — POTASSIUM CHLORIDE CRYS ER 20 MEQ PO TBCR
40.0000 meq | EXTENDED_RELEASE_TABLET | Freq: Once | ORAL | Status: AC
Start: 2020-04-20 — End: 2020-04-20
  Administered 2020-04-20: 40 meq via ORAL
  Filled 2020-04-20: qty 2

## 2020-04-20 MED ORDER — GUAIFENESIN-DM 100-10 MG/5ML PO SYRP: 10.0000 mL | ORAL_SOLUTION | ORAL | Status: DC | PRN

## 2020-04-20 MED ORDER — ENOXAPARIN SODIUM 40 MG/0.4ML ~~LOC~~ SOLN
40.0000 mg | SUBCUTANEOUS | Status: DC
  Administered 2020-04-21 – 2020-04-24 (×5): 40 mg via SUBCUTANEOUS
  Filled 2020-04-20 (×5): qty 0.4

## 2020-04-20 MED ORDER — SODIUM CHLORIDE 0.9 % IV SOLN
100.0000 mg | Freq: Every day | INTRAVENOUS | Status: AC
  Administered 2020-04-21 – 2020-04-24 (×4): 100 mg via INTRAVENOUS
  Filled 2020-04-20 (×4): qty 20

## 2020-04-20 MED ORDER — SODIUM CHLORIDE 0.9 % IV SOLN
1.0000 g | Freq: Once | INTRAVENOUS | Status: AC
  Administered 2020-04-20: 1 g via INTRAVENOUS
  Filled 2020-04-20: qty 10

## 2020-04-20 MED ORDER — SODIUM CHLORIDE 0.9% FLUSH
3.0000 mL | Freq: Two times a day (BID) | INTRAVENOUS | Status: DC
  Administered 2020-04-21 – 2020-04-25 (×2): 3 mL via INTRAVENOUS

## 2020-04-20 MED ORDER — ONDANSETRON HCL 4 MG/2ML IJ SOLN
4.0000 mg | Freq: Four times a day (QID) | INTRAMUSCULAR | Status: DC | PRN
  Administered 2020-04-22: 4 mg via INTRAVENOUS
  Filled 2020-04-20: qty 2

## 2020-04-20 MED ORDER — MAGNESIUM OXIDE 400 (241.3 MG) MG PO TABS
400.0000 mg | ORAL_TABLET | Freq: Once | ORAL | Status: AC
  Administered 2020-04-20: 400 mg via ORAL
  Filled 2020-04-20: qty 1

## 2020-04-20 MED ORDER — HYDROCODONE-ACETAMINOPHEN 5-325 MG PO TABS: 1.0000 | ORAL_TABLET | ORAL | Status: DC | PRN

## 2020-04-20 MED ORDER — POTASSIUM CHLORIDE 10 MEQ/100ML IV SOLN
10.0000 meq | INTRAVENOUS | Status: AC
  Administered 2020-04-20 – 2020-04-21 (×3): 10 meq via INTRAVENOUS
  Filled 2020-04-20 (×3): qty 100

## 2020-04-20 MED ORDER — SODIUM CHLORIDE 0.9 % IV SOLN
200.0000 mg | Freq: Once | INTRAVENOUS | Status: AC
  Administered 2020-04-21: 200 mg via INTRAVENOUS
  Filled 2020-04-20: qty 40

## 2020-04-20 MED ORDER — SODIUM CHLORIDE 0.9 % IV BOLUS
500.0000 mL | Freq: Once | INTRAVENOUS | Status: AC
  Administered 2020-04-20: 500 mL via INTRAVENOUS

## 2020-04-20 MED ORDER — SODIUM CHLORIDE 0.9% FLUSH: 3.0000 mL | Freq: Once | INTRAVENOUS | Status: DC

## 2020-04-20 NOTE — ED Provider Notes (Signed)
[facility] EMERGENCY DEPARTMENT Provider Note   CSN: [id number] Arrival date & time: 04/20/20  1806     History Chief Complaint  Patient presents with   Weakness    Ashley Black is a 79 y.o. female.  Patient with history of bradycardia, HTN --presents to the emergency department by ambulance for approximately 1 week to 10 days of generalized weakness, malaise, decreased oral intake.  Patient states that she was encouraged to come to the emergency department today by family who was concerned about her.  Her husband has also been sick.  Patient denies fevers or chills, ear pain, runny nose, sore throat.  She has not had any cough, chest pain or shortness of breath.  She has not been vomiting.  She had an episode of watery diarrhea just after arrival here, however this has not been ongoing over the past week.  She denies urinary symptoms, skin rashes, tick bites.  Patient has been fully vaccinated for coronavirus.  She denies any known sick contacts but does admit to being around other people at church.        Past Medical History:  Diagnosis Date   Bradycardia    testing done 2012 no cause found   Dizziness    brief occasions   Dyslipidemia    Edema of both legs    very rare   History of hysterectomy 1985   Pneumonia    few times, none recent    Patient Active Problem List   Diagnosis Date Noted   Dyslipidemia    Edema of both legs    Dizziness    Bradycardia     Past Surgical History:  Procedure Laterality Date   ABDOMINAL HYSTERECTOMY      1 ovary removed   back disc 5-6  1997   upper back   FLEXIBLE SIGMOIDOSCOPY N/A 06/21/2016   Procedure: FLEXIBLE SIGMOIDOSCOPY;  Surgeon: Garlan Fair, MD;  Location: [facility];  Service: Endoscopy;  Laterality: N/A;     OB History   No obstetric history on file.     Family History  Problem Relation Age of Onset   Heart disease Mother     Social History   Tobacco Use    Smoking status: Former Smoker    Types: Cigarettes    Quit date: 09/13/1987    Years since quitting: 32.6   Smokeless tobacco: Never Used  Substance Use Topics   Alcohol use: No   Drug use: No    Home Medications Prior to Admission medications   Medication Sig Start Date End Date Taking? Authorizing Provider  atorvastatin (LIPITOR) 20 MG tablet Take 20 mg by mouth at bedtime.    [provider]  loratadine (CLARITIN) 10 MG tablet Take 10 mg by mouth every morning.     [provider]  meloxicam (MOBIC) 7.5 MG tablet Take 7.5 mg by mouth daily.      [provider]  OVER THE COUNTER MEDICATION perservision  1 tab bid    [provider]  potassium chloride (K-DUR) 10 MEQ tablet Take one tablet by mouth daily. 12/12/11   Deboraha Sprang, MD  pravastatin (PRAVACHOL) 40 MG tablet Take 40 mg by mouth daily.      [provider]  triamterene-hydrochlorothiazide (MAXZIDE-25) 37.5-25 MG per tablet Take 1 tablet by mouth daily.      [provider]  UNABLE TO FIND Vitamin d 3 2000 IU per day    [provider]  Allergies    Patient has no known allergies.  Review of Systems   Review of Systems  Constitutional: Positive for activity change and fatigue. Negative for chills and fever.  HENT: Negative for rhinorrhea and sore throat.   Eyes: Negative for redness.  Respiratory: Negative for cough, shortness of breath and wheezing.   Cardiovascular: Negative for chest pain.  Gastrointestinal: Positive for diarrhea and nausea. Negative for abdominal pain and vomiting.  Genitourinary: Negative for dysuria, frequency, hematuria and urgency.  Musculoskeletal: Negative for myalgias.  Skin: Negative for rash.  Neurological: Positive for weakness. Negative for headaches.    Physical Exam Updated Vital Signs BP (!) 103/58 (BP Location: Right Arm)    Pulse (!) 45    Temp 98.6 F (37 C) (Oral)    Resp 17    Ht 5\' 5"  (1.651 m)    Wt 64.4  kg    SpO2 98%    BMI 23.63 kg/m   Physical Exam Vitals and nursing note reviewed.  Constitutional:      General: She is not in acute distress.    Appearance: She is well-developed.  HENT:     Head: Normocephalic and atraumatic.     Right Ear: Tympanic membrane, ear canal and external ear normal.     Left Ear: Tympanic membrane, ear canal and external ear normal.     Nose: Nose normal.     Mouth/Throat:     Mouth: Mucous membranes are dry.  Eyes:     Conjunctiva/sclera: Conjunctivae normal.  Cardiovascular:     Rate and Rhythm: Regular rhythm. Bradycardia present.     Heart sounds: No murmur heard.   Pulmonary:     Effort: No respiratory distress.     Breath sounds: No wheezing, rhonchi or rales.  Abdominal:     Palpations: Abdomen is soft.     Tenderness: There is no abdominal tenderness. There is no guarding or rebound.  Musculoskeletal:     Cervical back: Normal range of motion and neck supple.     Right lower leg: No edema.     Left lower leg: No edema.  Skin:    General: Skin is warm and dry.     Findings: No rash.  Neurological:     General: No focal deficit present.     Mental Status: She is alert. Mental status is at baseline.     Motor: No weakness.     Comments: No focal weakness.   Psychiatric:        Mood and Affect: Mood normal.     ED Results / Procedures / Treatments   Labs (all labs ordered are listed, but only abnormal results are displayed) Labs Reviewed  SARS CORONAVIRUS 2 BY RT PCR ([facility]) - Abnormal; Notable for the following components:      Result Value   SARS Coronavirus 2 POSITIVE (*)    All other components within normal limits  BASIC METABOLIC PANEL - Abnormal; Notable for the following components:   Sodium 134 (*)    Potassium 2.6 (*)    CO2 21 (*)    Glucose, Bld 107 (*)    Calcium 8.5 (*)    GFR calc non Af Amer 59 (*)    All other components within normal limits  CBC - Abnormal;  Notable for the following components:   WBC 3.8 (*)    Platelets 148 (*)    All other components within normal limits  URINALYSIS,  ROUTINE W REFLEX MICROSCOPIC - Abnormal; Notable for the following components:   APPearance HAZY (*)    Hgb urine dipstick SMALL (*)    Nitrite POSITIVE (*)    Leukocytes,Ua MODERATE (*)    Bacteria, UA MANY (*)    All other components within normal limits  CULTURE, BLOOD (ROUTINE X 2)  CULTURE, BLOOD (ROUTINE X 2)  URINE CULTURE  MAGNESIUM  LACTIC ACID, PLASMA  LACTIC ACID, PLASMA  D-DIMER, QUANTITATIVE (NOT AT [facility])  PROCALCITONIN  LACTATE DEHYDROGENASE  FERRITIN  TRIGLYCERIDES  FIBRINOGEN  C-REACTIVE PROTEIN  HEPATIC FUNCTION PANEL  CBG MONITORING, ED  TROPONIN I (HIGH SENSITIVITY)    EKG EKG Interpretation  Date/Time:  Monday April 20 2020 18:32:22 EDT Ventricular Rate:  56 PR Interval:  116 QRS Duration: 76 QT Interval:  418 QTC Calculation: 403 R Axis:   15 Text Interpretation: Sinus bradycardia ST & T wave abnormality, consider inferior ischemia ST & T wave abnormality, consider anterior ischemia Abnormal ECG Confirmed by Quintella Reichert [id number]) on 04/20/2020 8:46:00 PM   Radiology DG Chest Port 1 View  Result Date: 04/20/2020 CLINICAL DATA:  Weakness. COVID-19 positive. Shortness of breath with exertion. EXAM: PORTABLE CHEST 1 VIEW COMPARISON:  02/20/2015 FINDINGS: Borderline enlarged cardiac silhouette. Tortuous aorta. Clear lungs with normal vascularity. Cervical spine fixation hardware. IMPRESSION: No acute abnormality. Electronically Signed   By: Claudie Revering M.D.   On: 04/20/2020 21:27    Procedures Procedures (including critical care time)  Medications Ordered in ED Medications  sodium chloride flush (NS) 0.9 % injection 3 mL (3 mLs Intravenous Not Given 04/20/20 2045)  sodium chloride 0.9 % bolus 500 mL (has no administration in time range)  potassium chloride 10 mEq in 100 mL IVPB (has no administration in time range)    cefTRIAXone (ROCEPHIN) 1 g in sodium chloride 0.9 % 100 mL IVPB (has no administration in time range)  potassium chloride SA (KLOR-CON) CR tablet 40 mEq (40 mEq Oral Given 04/20/20 2146)  magnesium oxide (MAG-OX) tablet 400 mg (400 mg Oral Given 04/20/20 2146)    ED Course  I have reviewed the triage vital signs and the nursing notes.  Pertinent labs & imaging results that were available during my care of the patient were reviewed by me and considered in my medical decision making (see chart for details).  Patient seen and examined. Work-up initiated. Medications ordered.   Vital signs reviewed and are as follows: BP (!) 103/58 (BP Location: Right Arm)    Pulse (!) 45    Temp 98.6 F (37 C) (Oral)    Resp 17    Ht 5\' 5"  (1.651 m)    Wt 64.4 kg    SpO2 98%    BMI 23.63 kg/m   Pt is COVID+.  EKG personally reviewed.  Will replete potassium.  Additional work-up ordered as well as IV fluids.  From a respiratory standpoint, patient seems to be doing well.  However she was hypotensive and hypokalemic on arrival.  Given this, she will require admission to the hospital.  Patient discussed with Dr. Ralene Bathe.  10:07 PM Pt seen by Dr. Roel Cluck.      MDM Rules/Calculators/A&P                          Admit.   Final Clinical Impression(s) / ED Diagnoses Final diagnoses:  Hypokalemia  COVID-19  Hypotension due to hypovolemia  Acute cystitis without hematuria    Rx / DC  Orders ED Discharge Orders    None       Suann Larry 04/20/20 2208    Quintella Reichert, MD 04/20/20 2259

## 2020-04-20 NOTE — ED Triage Notes (Signed)
Pt presents from home via EMS with anorexia, generalized weakness, malaise, no energy x 2 weeks. Denies pain. SOB with exertion. Denies cough.   134/84 Hr 64 rr 18  spo2 98% cbg 105 97.85f

## 2020-04-20 NOTE — ED Notes (Signed)
Gwenlyn Fudge daughter [phone] would like an update on the pt

## 2020-04-20 NOTE — H&P (Signed)
ROSS HEFFERAN EZM:[id number] DOB: 1940/11/25 DOA: 04/20/2020     PCP: Harlan Stains, MD   Outpatient Specialists:  CARDS:  Dr. Haroldine Laws GI  Dr. Wynetta Emery    Patient arrived to ER on 04/20/20 at 1806 Referred by Attending Toy Baker, MD   Patient coming from: home Lives  With family    Chief Complaint:   Chief Complaint  Patient presents with  . Weakness    HPI: LAYLIANA DEVINS is a 79 y.o. female with medical history significant of HLD, bradycardia    Presented with   Decreased Po intake, no energy for the pst 2 wks, no fever no SOB No cough has been fully vaccinated. Patient has been somewhat confused.  He husband also been sick for the past 2 wks with similar symptoms Infectious risk factors:  Reports  Body aches, severe fatigue    Has  been vaccinated against COVID    Initial COVID TEST    POSITIVE,   in house  PCR testing  Pending  Lab Results  Component Value Date   [place] (A) 04/20/2020     Regarding pertinent Chronic problems:    Hyperlipidemia -  on statins Pravachol    While in ER: Noted to have Pottasium down to 2.6 and evidence of UTI CXR - unmemarkble  patient is rigoring Hospitalist was called for admission for COVID infection and hypokalemia  The following Work up has been ordered so far:  Orders Placed This Encounter  Procedures  . SARS Coronavirus 2 by RT PCR (hospital order, performed in [facility] hospital lab) Nasopharyngeal Nasopharyngeal Swab  . Blood Culture (routine x 2)  . Urine Culture  . DG Chest Port 1 View  . Basic metabolic panel  . CBC  . Urinalysis, Routine w reflex microscopic  . Magnesium  . Lactic acid, plasma  . D-dimer, quantitative  . Procalcitonin  . Lactate dehydrogenase  . Ferritin  . Triglycerides  . Fibrinogen  . C-reactive protein  . Hepatic function panel  . Magnesium  . CK  . Diet NPO time specified  . Cardiac monitoring  . Document Height and Actual Weight  . Saline  Lock IV, Maintain IV access  . Orthostatic vital signs  . Insert peripheral IV x 2  . Initiate Carrier Fluid Protocol  . Place surgical mask on patient  . Patient to wear surgical mask during transportation  . Assess patient for ability to self-prone. If able (can move self in bed, ambulate) and stable (SpO2 and oxygen requirement):  . RN/NT - Document specific oxygen requirements in CHL  . Notify EDP if new oxygen requirements escalates > 4L per minute [place]  . RN to draw the following extra tubes:  . Cardiac monitoring  . Consult to hospitalist  ALL PATIENTS BEING ADMITTED/HAVING PROCEDURES NEED COVID-19 SCREENING  . Airborne and Contact precautions  . Pulse oximetry, continuous  . CBG monitoring, ED  . ED EKG  . Place in observation (patient's expected length of stay will be less than 2 midnights)    Following Medications were ordered in ER: Medications  sodium chloride flush (NS) 0.9 % injection 3 mL (3 mLs Intravenous Not Given 04/20/20 2045)  sodium chloride 0.9 % bolus 500 mL (has no administration in time range)  potassium chloride 10 mEq in 100 mL IVPB (has no administration in time range)  cefTRIAXone (ROCEPHIN) 1 g in sodium chloride 0.9 % 100 mL IVPB (has no administration in time range)  potassium chloride  SA (KLOR-CON) CR tablet 40 mEq (40 mEq Oral Given 04/20/20 2146)  magnesium oxide (MAG-OX) tablet 400 mg (400 mg Oral Given 04/20/20 2146)        Consult Orders  (From admission, onward)         Start     Ordered   04/20/20 2132  Consult to hospitalist  ALL PATIENTS BEING ADMITTED/HAVING PROCEDURES NEED COVID-19 SCREENING Paged by Pollie Friar  Once       Comments: ALL PATIENTS BEING ADMITTED/HAVING PROCEDURES NEED COVID-19 SCREENING  Provider:  (Not yet assigned)  Question Answer Comment  Place call to: [facility]   Reason for Consult Admit      04/20/20 2131          Significant initial  Findings: Abnormal Labs Reviewed  SARS CORONAVIRUS 2 BY RT PCR  ([place] [facility]) - Abnormal; Notable for the following components:      Result Value   SARS Coronavirus 2 POSITIVE (*)    All other components within normal limits  BASIC METABOLIC PANEL - Abnormal; Notable for the following components:   Sodium 134 (*)    Potassium 2.6 (*)    CO2 21 (*)    Glucose, Bld 107 (*)    Calcium 8.5 (*)    GFR calc non Af Amer 59 (*)    All other components within normal limits  CBC - Abnormal; Notable for the following components:   WBC 3.8 (*)    Platelets 148 (*)    All other components within normal limits  URINALYSIS, ROUTINE W REFLEX MICROSCOPIC - Abnormal; Notable for the following components:   APPearance HAZY (*)    Hgb urine dipstick SMALL (*)    Nitrite POSITIVE (*)    Leukocytes,Ua MODERATE (*)    Bacteria, UA MANY (*)    All other components within normal limits     Otherwise labs showing:    Recent Labs  Lab 04/20/20 1837  NA 134*  K 2.6*  CO2 21*  GLUCOSE 107*  BUN 11  CREATININE 0.92  CALCIUM 8.5*    Cr   stable,    Lab Results  Component Value Date   CREATININE 0.92 04/20/2020   CREATININE 1.2 03/04/2011   CREATININE 1.1 02/14/2011    No results for input(s): AST, ALT, ALKPHOS, BILITOT, PROT, ALBUMIN in the last 168 hours. Lab Results  Component Value Date   CALCIUM 8.5 (L) 04/20/2020     WBC       Component Value Date/Time   WBC 3.8 (L) 04/20/2020 1837     Plt: Lab Results  Component Value Date   PLT 148 (L) 04/20/2020    Lactic Acid, Venous    Component Value Date/Time   LATICACIDVEN 2.2 (HH) 04/20/2020 2103     Procalcitonin   Ordered   COVID-19 Labs  Recent Labs    04/20/20 2101  DDIMER 0.47  LDH 247*    Lab Results  Component Value Date   SARSCOV2NAA POSITIVE (A) 04/20/2020     HG/HCT   stable,      Component Value Date/Time   HGB 14.0 04/20/2020 1837   HCT 41.5 04/20/2020 1837     Troponin 23  Cardiac Panel (last 3 results) No results  for input(s): CKTOTAL, CKMB, TROPONINI, RELINDX in the last 72 hours.     ECG: Ordered Personally reviewed by me showing: HR : 53 Rhythm: Sinus bradycardia    nonspecific changes,   QTC 403  UA  evidence of UTI      Urine analysis:    Component Value Date/Time   COLORURINE YELLOW 04/20/2020 2025   APPEARANCEUR HAZY (A) 04/20/2020 2025   LABSPEC 1.011 04/20/2020 2025   PHURINE 5.0 04/20/2020 2025   GLUCOSEU NEGATIVE 04/20/2020 2025   HGBUR SMALL (A) 04/20/2020 2025   BILIRUBINUR NEGATIVE 04/20/2020 2025   KETONESUR NEGATIVE 04/20/2020 2025   PROTEINUR NEGATIVE 04/20/2020 2025   NITRITE POSITIVE (A) 04/20/2020 2025   LEUKOCYTESUR MODERATE (A) 04/20/2020 2025       Ordered   CXR -  NON acute    ED Triage Vitals  Enc Vitals Group     BP 04/20/20 1828 (!) 84/61     Pulse Rate 04/20/20 1828 60     Resp 04/20/20 1828 18     Temp 04/20/20 1828 98.6 F (37 C)     Temp Source 04/20/20 1828 Oral     SpO2 04/20/20 1828 98 %     Weight 04/20/20 1815 142 lb (64.4 kg)     Height 04/20/20 1815 5\' 5"  (1.651 m)     Head Circumference --      Peak Flow --      Pain Score 04/20/20 1815 0     Pain Loc --      Pain Edu? --      Excl. in [place]? --   TMAX(24)@       Latest  Blood pressure 140/66, pulse (!) 54, temperature 98.6 F (37 C), temperature source Oral, resp. rate 19, height 5\' 5"  (1.651 m), weight 64.4 kg, SpO2 91 %.   Review of Systems:    Pertinent positives include:  Fatigue confusion  Constitutional:  No weight loss, night sweats, Fevers, chills,, weight loss  HEENT:  No headaches, Difficulty swallowing,Tooth/dental problems,Sore throat,  No sneezing, itching, ear ache, nasal congestion, post nasal drip,  Cardio-vascular:  No chest pain, Orthopnea, PND, anasarca, dizziness, palpitations.no Bilateral lower extremity swelling  GI:  No heartburn, indigestion, abdominal pain, nausea, vomiting, diarrhea, change in bowel habits, loss of appetite, melena, blood in  stool, hematemesis Resp:  no shortness of breath at rest. No dyspnea on exertion, No excess mucus, no productive cough, No non-productive cough, No coughing up of blood.No change in color of mucus.No wheezing. Skin:  no rash or lesions. No jaundice GU:  no dysuria, change in color of urine, no urgency or frequency. No straining to urinate.  No flank pain.  Musculoskeletal:  No joint pain or no joint swelling. No decreased range of motion. No back pain.  Psych:  No change in mood or affect. No depression or anxiety. No memory loss.  Neuro: no localizing neurological complaints, no tingling, no weakness, no double vision, no gait abnormality, no slurred speech, no   All systems reviewed and apart from [place] all are negative  Past Medical History:   Past Medical History:  Diagnosis Date  . Bradycardia    testing done 2012 no cause found  . Dizziness    brief occasions  . Dyslipidemia   . Edema of both legs    very rare  . History of hysterectomy 1985  . Pneumonia    few times, none recent      Past Surgical History:  Procedure Laterality Date  . ABDOMINAL HYSTERECTOMY      1 ovary removed  . back disc 5-6  1997   upper back  . FLEXIBLE SIGMOIDOSCOPY N/A 06/21/2016   Procedure: FLEXIBLE SIGMOIDOSCOPY;  Surgeon:  Garlan Fair, MD;  Location: [facility];  Service: Endoscopy;  Laterality: N/A;    Social History:  Ambulatory  Independently      reports that she quit smoking about 32 years ago. Her smoking use included cigarettes. She has never used smokeless tobacco. She reports that she does not drink alcohol and does not use drugs.    Family History:   Family History  Problem Relation Age of Onset  . Heart disease Mother     Allergies: No Known Allergies   Prior to Admission medications   Medication Sig Start Date End Date Taking? Authorizing Provider  atorvastatin (LIPITOR) 20 MG tablet Take 20 mg by mouth at bedtime.   Yes [provider]    loratadine (CLARITIN) 10 MG tablet Take 10 mg by mouth every morning.    Yes [provider]  meloxicam (MOBIC) 7.5 MG tablet Take 7.5 mg by mouth daily.     Yes [provider]  potassium chloride (K-DUR) 10 MEQ tablet Take one tablet by mouth daily. 12/12/11  Yes Deboraha Sprang, MD  pravastatin (PRAVACHOL) 40 MG tablet Take 40 mg by mouth daily.     Yes [provider]  triamterene-hydrochlorothiazide (MAXZIDE-25) 37.5-25 MG per tablet Take 1 tablet by mouth daily.     Yes [provider]  OVER THE COUNTER MEDICATION perservision  1 tab bid    [provider]  UNABLE TO FIND Vitamin d 3 2000 IU per day    [provider]   Physical Exam: Vitals with BMI 04/20/2020 04/20/2020 04/20/2020  Height - - -  Weight - - -  BMI - - -  Systolic 952 841 324  Diastolic 66 75 58  Pulse 54 54 45     1. General:  in No Acute distress    acutely ill -appearing 2. Psychological: Alert and  Oriented 3. Head/ENT:    Dry Mucous Membranes                          Head Non traumatic, neck supple                            Poor Dentition 4. SKIN:  decreased Skin turgor,  Skin clean Dry and intact no rash 5. Heart: Regular rate and rhythm no  Murmur, no Rub or gallop 6. Lungs:   no wheezes or crackles   7. Abdomen: Soft,  non-tender, Non distended  bowel sounds present 8. Lower extremities: no clubbing, cyanosis, no  edema 9. Neurologically Grossly intact, moving all 4 extremities equally  10. MSK: Normal range of motion   All other LABS:     Recent Labs  Lab 04/20/20 1837  WBC 3.8*  HGB 14.0  HCT 41.5  MCV 83.2  PLT 148*     Recent Labs  Lab 04/20/20 1837  NA 134*  K 2.6*  CL 101  CO2 21*  GLUCOSE 107*  BUN 11  CREATININE 0.92  CALCIUM 8.5*     No results for input(s): AST, ALT, ALKPHOS, BILITOT, PROT, ALBUMIN in the last 168 hours.     Cultures: No results found for: [place], [place], CULT, REPTSTATUS   Radiological Exams  on Admission: DG Chest Port 1 View  Result Date: 04/20/2020 CLINICAL DATA:  Weakness. COVID-19 positive. Shortness of breath with exertion. EXAM: PORTABLE CHEST 1 VIEW COMPARISON:  02/20/2015 FINDINGS: Borderline enlarged cardiac silhouette.  Tortuous aorta. Clear lungs with normal vascularity. Cervical spine fixation hardware. IMPRESSION: No acute abnormality. Electronically Signed   By: Claudie Revering M.D.   On: 04/20/2020 21:27    Chart has been reviewed    Assessment/Plan   79 y.o. female with medical history significant of HLD, bradycardia Admitted for hypokalemia and Covid infection  Present on Admission: . COVID-19 virus infection -  Patient is fully vaccinated ER Novel Corona Virus testing: Ordered 04/20/20 and is positive      Following concerning LAB/ imaging findings:     CRP, LDH: increased        -Following work-up initiated:      sputum cultures  Ordered 04/20/20, Blood cultures  Ordered 04/20/20,     Following complications noted:     nausea/vomiting Diarrhea , decreased PO intake resulting in dehydration - will rehydrate   Plan of treatment: Admit on Airborn Precautions  -  pharmacy consult for remdesivir  given no pulmonary involvement hold off on steroids - Will follow daily d.dimer - Assess for ability to prone  - Supportive management -Fluid sparing resuscitation  -Provide oxygen as needed currently on RA  SpO2: 91 % - IF d.dimer elvated >5 will increase dose of lovenox    Poor Prognostic factors  79 y.o.  Personal hx of HLD        Will order Airborne and Contact precautions  Family/ patient prognosis discussion: I have discussed case with the family/ patient  who are aware of their prognosis At this point they would like  full code     The treatment plan and use of medications and known side effects were discussed with patient family. It was clearly explained that there is no proven definitive treatment for COVID-19 infection yet. Any  medications used here are based on case reports/anecdotal data which are not peer-reviewed and has not been studied using randomized control trials.  Complete risks and long-term side effects are unknown, however in the best clinical judgment they seem to be of some clinical benefit rather than medical risks.  Patient and family agree with the treatment plan and want to receive these treatments as indicated.     . Bradycardia - chronic stable  . Dyslipidemia - chronic continue home meds  . Hypokalemia - - will replace and repeat in AM,  check magnesium level and replace as needed  . Acute lower UTI -  - treat with Rocephin        await results of urine culture and adjust antibiotic coverage as needed  . Dehydration-  Will gently rehydrate   Other plan as per orders.  DVT prophylaxis:    Lovenox       Code Status:    Code Status: Not on file FULL CODE   as per patient   I had personally discussed CODE STATUS with patient     Family Communication:   Family  at  Bedside  plan of care was discussed on the phone with   Daughter   Disposition Plan:     To home once workup is complete and patient is stable                               Electrolytes corrected  Will need to be able to tolerate PO                         Consults called: none  Admission status:  ED Disposition    ED Disposition Condition [place]: [place] [100100]  Level of Care: Telemetry Medical [104]  I expect the patient will be discharged within 24 hours: No (not a candidate for 5C-Observation unit)  Covid Evaluation: Confirmed COVID Positive  Diagnosis: COVID-19 virus infection [[id number]]  Admitting Physician: Toy Baker [[id number]]  Attending Physician: Toy Baker [[id number]]        Obs    Level of care     tele    indefinitely please discontinue once patient no longer qualifies COVID-19  Labs    Lab Results  Component Value Date   [place] (A) 04/20/2020     Precautions: admitted as  covid positive Airborne and Contact precautions   PPE: Used by the provider:   P100  eye Goggles,  Gloves  gown      Alana Dayton 04/20/2020, 10:53 PM    [facility]     after 2 AM please page floor coverage PA If 7AM-7PM, please contact the day team taking care of the patient using [email or web address]   Patient was evaluated in the context of the global COVID-19 pandemic, which necessitated consideration that the patient might be at risk for infection with the SARS-CoV-2 virus that causes COVID-19. Institutional protocols and algorithms that pertain to the evaluation of patients at risk for COVID-19 are in a state of rapid change based on information released by regulatory bodies including the CDC and federal and state organizations. These policies and algorithms were followed during the patient's care.

## 2020-04-21 ENCOUNTER — Encounter (HOSPITAL_COMMUNITY): Payer: Self-pay | Admitting: Internal Medicine

## 2020-04-21 DIAGNOSIS — N3000 Acute cystitis without hematuria: Secondary | ICD-10-CM | POA: Diagnosis present

## 2020-04-21 DIAGNOSIS — E860 Dehydration: Secondary | ICD-10-CM | POA: Diagnosis present

## 2020-04-21 DIAGNOSIS — E876 Hypokalemia: Secondary | ICD-10-CM | POA: Diagnosis present

## 2020-04-21 DIAGNOSIS — A0839 Other viral enteritis: Secondary | ICD-10-CM | POA: Diagnosis present

## 2020-04-21 DIAGNOSIS — N390 Urinary tract infection, site not specified: Secondary | ICD-10-CM | POA: Diagnosis not present

## 2020-04-21 DIAGNOSIS — Z87891 Personal history of nicotine dependence: Secondary | ICD-10-CM | POA: Diagnosis not present

## 2020-04-21 DIAGNOSIS — I10 Essential (primary) hypertension: Secondary | ICD-10-CM | POA: Diagnosis present

## 2020-04-21 DIAGNOSIS — I9589 Other hypotension: Secondary | ICD-10-CM | POA: Diagnosis present

## 2020-04-21 DIAGNOSIS — Z79899 Other long term (current) drug therapy: Secondary | ICD-10-CM | POA: Diagnosis not present

## 2020-04-21 DIAGNOSIS — Z90710 Acquired absence of both cervix and uterus: Secondary | ICD-10-CM | POA: Diagnosis not present

## 2020-04-21 DIAGNOSIS — U071 COVID-19: Secondary | ICD-10-CM | POA: Diagnosis present

## 2020-04-21 DIAGNOSIS — E861 Hypovolemia: Secondary | ICD-10-CM | POA: Diagnosis present

## 2020-04-21 DIAGNOSIS — Z8249 Family history of ischemic heart disease and other diseases of the circulatory system: Secondary | ICD-10-CM | POA: Diagnosis not present

## 2020-04-21 DIAGNOSIS — E785 Hyperlipidemia, unspecified: Secondary | ICD-10-CM | POA: Diagnosis present

## 2020-04-21 DIAGNOSIS — R001 Bradycardia, unspecified: Secondary | ICD-10-CM | POA: Diagnosis present

## 2020-04-21 LAB — D-DIMER, QUANTITATIVE: D-Dimer, Quant: <0.27 ug/mL-FEU (ref 0.00–0.50)

## 2020-04-21 LAB — CBC WITH DIFFERENTIAL/PLATELET
Abs Immature Granulocytes: 0 10*3/uL (ref 0.00–0.07)
Basophils Absolute: 0 10*3/uL (ref 0.0–0.1)
Basophils Relative: 0 %
Eosinophils Absolute: 0 10*3/uL (ref 0.0–0.5)
Eosinophils Relative: 0 %
HCT: 38.6 % (ref 36.0–46.0)
Hemoglobin: 13.1 g/dL (ref 12.0–15.0)
Immature Granulocytes: 0 %
Lymphocytes Relative: 41 %
Lymphs Abs: 1.4 10*3/uL (ref 0.7–4.0)
MCH: 29.1 pg (ref 26.0–34.0)
MCHC: 33.9 g/dL (ref 30.0–36.0)
MCV: 85.8 fL (ref 80.0–100.0)
Monocytes Absolute: 0.4 10*3/uL (ref 0.1–1.0)
Monocytes Relative: 12 %
Neutro Abs: 1.7 10*3/uL (ref 1.7–7.7)
Neutrophils Relative %: 47 %
Platelets: 136 10*3/uL — ABNORMAL LOW (ref 150–400)
RBC: 4.5 MIL/uL (ref 3.87–5.11)
RDW: 12.2 % (ref 11.5–15.5)
WBC: 3.5 10*3/uL — ABNORMAL LOW (ref 4.0–10.5)
nRBC: 0 % (ref 0.0–0.2)

## 2020-04-21 LAB — URINE CULTURE

## 2020-04-21 LAB — C-REACTIVE PROTEIN: CRP: 0.9 mg/dL (ref <1.0)

## 2020-04-21 LAB — COMPREHENSIVE METABOLIC PANEL
ALT: 16 U/L (ref 0–44)
AST: 21 U/L (ref 15–41)
Albumin: 2.8 g/dL — ABNORMAL LOW (ref 3.5–5.0)
Alkaline Phosphatase: 52 U/L (ref 38–126)
Anion gap: 10 (ref 5–15)
BUN: 9 mg/dL (ref 8–23)
CO2: 22 mmol/L (ref 22–32)
Calcium: 8.2 mg/dL — ABNORMAL LOW (ref 8.9–10.3)
Chloride: 110 mmol/L (ref 98–111)
Creatinine, Ser: 0.9 mg/dL (ref 0.44–1.00)
GFR calc Af Amer: >60 mL/min (ref >60)
GFR calc non Af Amer: >60 mL/min (ref >60)
Glucose, Bld: 98 mg/dL (ref 70–99)
Potassium: 3.1 mmol/L — ABNORMAL LOW (ref 3.5–5.1)
Sodium: 142 mmol/L (ref 135–145)
Total Bilirubin: 0.4 mg/dL (ref 0.3–1.2)
Total Protein: 5.7 g/dL — ABNORMAL LOW (ref 6.5–8.1)

## 2020-04-21 LAB — PHOSPHORUS: Phosphorus: 2 mg/dL — ABNORMAL LOW (ref 2.5–4.6)

## 2020-04-21 LAB — ABO/RH: ABO/RH(D): A POS

## 2020-04-21 LAB — LACTIC ACID, PLASMA
Lactic Acid, Venous: 1.4 mmol/L (ref 0.5–1.9)
Lactic Acid, Venous: 1.6 mmol/L (ref 0.5–1.9)

## 2020-04-21 LAB — FERRITIN: Ferritin: 222 ng/mL (ref 11–307)

## 2020-04-21 LAB — MAGNESIUM: Magnesium: 2 mg/dL (ref 1.7–2.4)

## 2020-04-21 MED ORDER — POTASSIUM CHLORIDE 10 MEQ/100ML IV SOLN
INTRAVENOUS | Status: AC
  Administered 2020-04-21: 10 meq via INTRAVENOUS
  Filled 2020-04-21: qty 100

## 2020-04-21 MED ORDER — POTASSIUM CHLORIDE 10 MEQ/100ML IV SOLN
10.0000 meq | INTRAVENOUS | Status: AC
  Administered 2020-04-21 (×3): 10 meq via INTRAVENOUS
  Filled 2020-04-21 (×3): qty 100

## 2020-04-21 NOTE — ED Notes (Signed)
Pt up to bedside commode with assistance. Pt transferred onto hospital bed.

## 2020-04-21 NOTE — ED Notes (Signed)
RN attempted twice to draw labs, unsuccessful. Phlebotomy to try.

## 2020-04-21 NOTE — ED Notes (Signed)
Phlebotomy attempted to draw labs twice without success.

## 2020-04-21 NOTE — ED Notes (Signed)
Lunch Tray Ordered @ 1048. °

## 2020-04-21 NOTE — ED Notes (Signed)
Sitting up in bed ate small amt, pudding and little jello, states she was feeling a little better.

## 2020-04-21 NOTE — ED Notes (Signed)
Am products given with fresh pitcher of water

## 2020-04-21 NOTE — Progress Notes (Addendum)
PROGRESS NOTE    Ashley Black  JOI:[id number] DOB: 1941/06/11 DOA: 04/20/2020 PCP: Ashley Stains, MD     Brief Narrative:  79 y.o. WF PMHx HLD, bradycardia, dyslipidemia,    Presented with   Decreased Po intake, no energy for the pst 2 wks, no fever no SOB No cough has been fully vaccinated. Patient has been somewhat confused.  He husband also been sick for the past 2 wks with similar symptoms   Subjective: Afebrile last 24 hours A/O x3 (does not recall month).  States her husband is also here in the ED with Covid symptoms.  States her symptoms consist of generalized weakness and fatigue with diarrhea.  Mild nausea, negative vomiting.   Assessment & Plan: Covid vaccination; positive vaccination (breakthrough)   Active Problems:   Dyslipidemia   Bradycardia   COVID-19 virus infection   Hypokalemia   Acute lower UTI   Dehydration   Covid gastroenteritis -Patient with nausea and diarrhea consistent with Covid gastroenteritis -Patient is dehydrated and has electrolyte imbalances secondary to above. -Will treat Covid infection  rehydrate,, correct electrolyte imbalances. -Remdesivir per pharmacy protocol COVID-19 Labs  Recent Labs    04/20/20 2101 04/21/20 0840  DDIMER 0.47 <0.27  FERRITIN 290 222  LDH 247*  --   CRP 1.7* 0.9    Lab Results  Component Value Date   SARSCOV2NAA POSITIVE (A) 04/20/2020    Dehydration -Secondary to diarrhea and anorexia. -Hydrate normal saline 50ml/hr  Anorexia -Patient willing to try full liquid diet.  Hypokalemia -Potassium IV 50 mEq  UTI -Urinalysis on admission consistent with UTI. -Empirically treat.  Complete 5-day course antibiotics -Urine culture pending   DVT prophylaxis: Lovenox Code Status: Full Family Communication:  Status is: Inpatient    Dispo: The patient is from: Home              Anticipated d/c is to: Home              Anticipated d/c date is: 8/15               Patient currently  unstable      Consultants:    Procedures/Significant Events:    I have personally reviewed and interpreted all radiology studies and my findings are as above.  VENTILATOR SETTINGS: Room air 8/10 SPO2 98%   Cultures   Antimicrobials: Anti-infectives (From admission, onward)   Start     Ordered Stop   04/21/20 2100  cefTRIAXone (ROCEPHIN) 1 g in sodium chloride 0.9 % 100 mL IVPB     Discontinue     04/20/20 2225     04/21/20 1000  remdesivir 100 mg in sodium chloride 0.9 % 100 mL IVPB     Discontinue    "Followed by" Linked Group Details   04/20/20 2223 04/25/20 0959   04/20/20 2230  remdesivir 200 mg in sodium chloride 0.9% 250 mL IVPB       "Followed by" Linked Group Details   04/20/20 2223 04/21/20 0420   04/20/20 2115  cefTRIAXone (ROCEPHIN) 1 g in sodium chloride 0.9 % 100 mL IVPB        04/20/20 2114 04/20/20 2355       Devices    LINES / TUBES:      Continuous Infusions: . sodium chloride 75 mL/hr at 04/20/20 2303  . cefTRIAXone (ROCEPHIN)  IV    . remdesivir 100 mg in NS 100 mL       Objective: Vitals:   04/21/20 0430 04/21/20  0515 04/21/20 0551 04/21/20 0750  BP: (!) 148/70 (!) 143/73 (!) 154/80 123/88  Pulse: (!) 50 (!) 53 (!) 51 (!) 51  Resp: 12 13 14 14   Temp:   98 F (36.7 C) 97.6 F (36.4 C)  TempSrc:   Oral Oral  SpO2: 100% 98% 99% 99%  Weight:      Height:       No intake or output data in the 24 hours ending 04/21/20 1038 Filed Weights   04/20/20 1815  Weight: 64.4 kg    Examination:  General: A/O x3 (does not know when), No acute respiratory distress Eyes: negative scleral hemorrhage, negative anisocoria, negative icterus ENT: Negative Runny nose, negative gingival bleeding, Neck:  Negative scars, masses, torticollis, lymphadenopathy, JVD Lungs: Clear to auscultation bilaterally without wheezes or crackles Cardiovascular: Regular rate and rhythm without murmur gallop or rub normal S1 and S2 Abdomen: negative abdominal  pain, nondistended, positive soft, bowel sounds, no rebound, no ascites, no appreciable mass Extremities: No significant cyanosis, clubbing, or edema bilateral lower extremities Skin: Negative rashes, lesions, ulcers Psychiatric:  Negative depression, negative anxiety, negative fatigue, negative mania  Central nervous system:  Cranial nerves II through XII intact, tongue/uvula midline, all extremities muscle strength 5/5, sensation intact throughout, negative dysarthria, negative expressive aphasia, negative receptive aphasia.  .     Data Reviewed: Care during the described time interval was provided by me .  I have reviewed this patient's available data, including medical history, events of note, physical examination, and all test results as part of my evaluation.  CBC: Recent Labs  Lab 04/20/20 1837 04/21/20 0840  WBC 3.8* 3.5*  NEUTROABS  --  1.7  HGB 14.0 13.1  HCT 41.5 38.6  MCV 83.2 85.8  PLT 148* 893*   Basic Metabolic Panel: Recent Labs  Lab 04/20/20 1837 04/20/20 2101 04/21/20 0840  NA 134*  --  142  K 2.6*  --  3.1*  CL 101  --  110  CO2 21*  --  22  GLUCOSE 107*  --  98  BUN 11  --  9  CREATININE 0.92  --  0.90  CALCIUM 8.5*  --  8.2*  MG  --  1.8 2.0  PHOS  --   --  2.0*   GFR: Estimated Creatinine Clearance: 44.9 mL/min (by C-G formula based on SCr of 0.9 mg/dL). Liver Function Tests: Recent Labs  Lab 04/20/20 2101 04/21/20 0840  AST 28 21  ALT 19 16  ALKPHOS 66 52  BILITOT 1.1 0.4  PROT 7.2 5.7*  ALBUMIN 3.6 2.8*   No results for input(s): LIPASE, AMYLASE in the last 168 hours. No results for input(s): AMMONIA in the last 168 hours. Coagulation Profile: No results for input(s): INR, PROTIME in the last 168 hours. Cardiac Enzymes: No results for input(s): CKTOTAL, CKMB, CKMBINDEX, TROPONINI in the last 168 hours. BNP (last 3 results) No results for input(s): PROBNP in the last 8760 hours. HbA1C: No results for input(s): HGBA1C in the last  72 hours. CBG: No results for input(s): GLUCAP in the last 168 hours. Lipid Profile: Recent Labs    04/20/20 2103  TRIG 146   Thyroid Function Tests: No results for input(s): TSH, T4TOTAL, FREET4, T3FREE, THYROIDAB in the last 72 hours. Anemia Panel: Recent Labs    04/20/20 2101 04/21/20 0840  FERRITIN 290 222   Sepsis Labs: Recent Labs  Lab 04/20/20 2101 04/20/20 2103 04/21/20 0000 04/21/20 0840  PROCALCITON 1.48  --   --   --  LATICACIDVEN  --  2.2* 1.6 1.4    Recent Results (from the past 240 hour(s))  SARS Coronavirus 2 by RT PCR (hospital order, performed in [facility] hospital lab) Nasopharyngeal Nasopharyngeal Swab     Status: Abnormal   Collection Time: 04/20/20  6:40 PM   Specimen: Nasopharyngeal Swab  Result Value Ref Range Status   SARS Coronavirus 2 POSITIVE (A) NEGATIVE Final    Comment: RESULT CALLED TO, READ BACK BY AND VERIFIED WITH: K [facility] RN 2039 04/20/20 A BROWNING (NOTE) SARS-CoV-2 target nucleic acids are DETECTED  SARS-CoV-2 RNA is generally detectable in upper respiratory specimens  during the acute phase of infection.  Positive results are indicative  of the presence of the identified virus, but do not rule out bacterial infection or co-infection with other pathogens not detected by the test.  Clinical correlation with patient history and  other diagnostic information is necessary to determine patient infection status.  The expected result is negative.  Fact Sheet for Patients:   StrictlyIdeas.no   Fact Sheet for Healthcare Providers:   BankingDealers.co.za    This test is not yet approved or cleared by the [place] FDA and  has been authorized for detection and/or diagnosis of SARS-CoV-2 by FDA under an Emergency Use Authorization (EUA).  This EUA will remain in effect (meaning this  test can be used) for the duration of  the COVID-19 declaration under Section 564(b)(1) of the  Act, 21 U.S.C. section 360-bbb-3(b)(1), unless the authorization is terminated or revoked sooner.  Performed at [facility], [place] [address] [place], [place] [address]          Radiology Studies: DG Chest Port 1 View  Result Date: 04/20/2020 CLINICAL DATA:  Weakness. COVID-19 positive. Shortness of breath with exertion. EXAM: PORTABLE CHEST 1 VIEW COMPARISON:  02/20/2015 FINDINGS: Borderline enlarged cardiac silhouette. Tortuous aorta. Clear lungs with normal vascularity. Cervical spine fixation hardware. IMPRESSION: No acute abnormality. Electronically Signed   By: Claudie Revering M.D.   On: 04/20/2020 21:27        Scheduled Meds: . enoxaparin (LOVENOX) injection  40 mg Subcutaneous Q24H  . sodium chloride flush  3 mL Intravenous Once  . sodium chloride flush  3 mL Intravenous Q12H   Continuous Infusions: . sodium chloride 75 mL/hr at 04/20/20 2303  . cefTRIAXone (ROCEPHIN)  IV    . remdesivir 100 mg in NS 100 mL       LOS: 0 days    Time spent:40 min    Jeryl Umholtz, Geraldo Docker, MD [facility] Pager [phone]  If 7PM-7AM, please contact night-coverage [email or web address] Password [facility] 04/21/2020, 10:38 AM

## 2020-04-21 NOTE — ED Notes (Signed)
Patient confused no weakness , no slurred speech. Appeared to clear some after talking with daughter on the phone. MD messaged.

## 2020-04-22 DIAGNOSIS — A0839 Other viral enteritis: Secondary | ICD-10-CM

## 2020-04-22 LAB — COMPREHENSIVE METABOLIC PANEL
ALT: 16 U/L (ref 0–44)
AST: 23 U/L (ref 15–41)
Albumin: 2.7 g/dL — ABNORMAL LOW (ref 3.5–5.0)
Alkaline Phosphatase: 56 U/L (ref 38–126)
Anion gap: 11 (ref 5–15)
BUN: 10 mg/dL (ref 8–23)
CO2: 21 mmol/L — ABNORMAL LOW (ref 22–32)
Calcium: 8.2 mg/dL — ABNORMAL LOW (ref 8.9–10.3)
Chloride: 109 mmol/L (ref 98–111)
Creatinine, Ser: 0.81 mg/dL (ref 0.44–1.00)
GFR calc Af Amer: >60 mL/min (ref >60)
GFR calc non Af Amer: >60 mL/min (ref >60)
Glucose, Bld: 92 mg/dL (ref 70–99)
Potassium: 3.5 mmol/L (ref 3.5–5.1)
Sodium: 141 mmol/L (ref 135–145)
Total Bilirubin: 0.3 mg/dL (ref 0.3–1.2)
Total Protein: 5.5 g/dL — ABNORMAL LOW (ref 6.5–8.1)

## 2020-04-22 LAB — CBC WITH DIFFERENTIAL/PLATELET
Abs Immature Granulocytes: 0.01 10*3/uL (ref 0.00–0.07)
Basophils Absolute: 0 10*3/uL (ref 0.0–0.1)
Basophils Relative: 0 %
Eosinophils Absolute: 0 10*3/uL (ref 0.0–0.5)
Eosinophils Relative: 0 %
HCT: 36.7 % (ref 36.0–46.0)
Hemoglobin: 12.4 g/dL (ref 12.0–15.0)
Immature Granulocytes: 0 %
Lymphocytes Relative: 25 %
Lymphs Abs: 1.1 10*3/uL (ref 0.7–4.0)
MCH: 28.8 pg (ref 26.0–34.0)
MCHC: 33.8 g/dL (ref 30.0–36.0)
MCV: 85.2 fL (ref 80.0–100.0)
Monocytes Absolute: 0.5 10*3/uL (ref 0.1–1.0)
Monocytes Relative: 10 %
Neutro Abs: 2.9 10*3/uL (ref 1.7–7.7)
Neutrophils Relative %: 65 %
Platelets: 146 10*3/uL — ABNORMAL LOW (ref 150–400)
RBC: 4.31 MIL/uL (ref 3.87–5.11)
RDW: 12.2 % (ref 11.5–15.5)
WBC: 4.5 10*3/uL (ref 4.0–10.5)
nRBC: 0 % (ref 0.0–0.2)

## 2020-04-22 LAB — MAGNESIUM: Magnesium: 1.8 mg/dL (ref 1.7–2.4)

## 2020-04-22 LAB — D-DIMER, QUANTITATIVE: D-Dimer, Quant: 0.31 ug/mL-FEU (ref 0.00–0.50)

## 2020-04-22 LAB — PHOSPHORUS: Phosphorus: 1.8 mg/dL — ABNORMAL LOW (ref 2.5–4.6)

## 2020-04-22 LAB — C-REACTIVE PROTEIN: CRP: 0.6 mg/dL (ref <1.0)

## 2020-04-22 LAB — FERRITIN: Ferritin: 250 ng/mL (ref 11–307)

## 2020-04-22 MED ORDER — ENSURE ENLIVE PO LIQD
237.0000 mL | Freq: Two times a day (BID) | ORAL | Status: DC
  Administered 2020-04-22 – 2020-04-24 (×5): 237 mL via ORAL

## 2020-04-22 MED ORDER — POTASSIUM CHLORIDE 10 MEQ/100ML IV SOLN
INTRAVENOUS | Status: AC
  Filled 2020-04-22: qty 100

## 2020-04-22 MED ORDER — PANTOPRAZOLE SODIUM 40 MG PO TBEC
40.0000 mg | DELAYED_RELEASE_TABLET | Freq: Every day | ORAL | Status: DC
  Administered 2020-04-23 – 2020-04-25 (×3): 40 mg via ORAL
  Filled 2020-04-22 (×3): qty 1

## 2020-04-22 MED ORDER — POTASSIUM PHOSPHATES 15 MMOLE/5ML IV SOLN
20.0000 mmol | Freq: Once | INTRAVENOUS | Status: AC
  Administered 2020-04-22: 20 mmol via INTRAVENOUS
  Filled 2020-04-22: qty 6.67

## 2020-04-22 MED ORDER — POTASSIUM CHLORIDE CRYS ER 20 MEQ PO TBCR
40.0000 meq | EXTENDED_RELEASE_TABLET | Freq: Once | ORAL | Status: AC
  Administered 2020-04-22: 40 meq via ORAL
  Filled 2020-04-22: qty 2

## 2020-04-22 NOTE — Progress Notes (Signed)
Ok to stop ceftriaxone after dose to day to complete 3d per Dr. Waldron [facility].  Onnie Boer, PharmD, BCIDP, AAHIVP, CPP Infectious Disease Pharmacist 04/22/2020 2:19 PM

## 2020-04-22 NOTE — Progress Notes (Signed)
PROGRESS NOTE    Ashley Black  BWG:[id number] DOB: 1941/01/07 DOA: 04/20/2020 PCP: Harlan Stains, MD     Brief Narrative:   79 y.o. WF PMHx HLD, bradycardia, dyslipidemia,    Presented with   Decreased Po intake, no energy for the pst 2 wks, no fever no SOB No cough has been fully vaccinated. Patient has been somewhat confused.  He husband also been sick for the past 2 wks with similar symptoms   Subjective:  Patient reports generalized weakness, very poor appetite, as discussed with staff she did not eat much from her breakfast,   Assessment & Plan: Covid vaccination; positive vaccination (breakthrough)   Active Problems:   Dyslipidemia   Bradycardia   COVID-19 virus infection   Hypokalemia   Acute lower UTI   Dehydration   Gastroenteritis due to COVID-19 virus   Covid gastroenteritis -Presents with significant nausea, diarrhea, and very poor oral intake, pressure consistent with typical Covid gastroenteritis, continue to monitor electrolytes closely, she is with hypokalemia and hypophosphatemia today which is being repleted, she remains with very poor appetite, I will add Ensure to her diet, she is on IV fluids as well. -We will change her full liquid diet to regular. -Continue with IV remdesivir.  COVID-19 Labs  Recent Labs    04/20/20 2101 04/21/20 0840 04/22/20 0116  DDIMER 0.47 <0.27 0.31  FERRITIN 290 222 250  LDH 247*  --   --   CRP 1.7* 0.9 0.6    Lab Results  Component Value Date   SARSCOV2NAA POSITIVE (A) 04/20/2020    Dehydration -Secondary to diarrhea and anorexia. -Hydrate normal saline 32ml/hr  Hypokalemia/hypophosphatemia -Repleted  UTI -Urinalysis on admission consistent with UTI. -Empirically treat.  Finished 3 days treatment course.  -Fortunately urine culture is unhelpful as growing multiple species.   DVT prophylaxis: Lovenox Code Status: Full Family Communication: tried to reach daughter , left  a Advertising account executive. Status is:  Inpatient    Dispo: The patient is from: Home              Anticipated d/c is to: Extremely weak, will consult PT/OT.              Anticipated d/c date is: 8/14              Patient currently unstable      Consultants: none   Procedures/Significant Events:    I have personally reviewed and interpreted all radiology studies and my findings are as above.   Cultures   Antimicrobials: Anti-infectives (From admission, onward)   Start     Ordered Stop   04/21/20 2100  cefTRIAXone (ROCEPHIN) 1 g in sodium chloride 0.9 % 100 mL IVPB     Discontinue     04/20/20 2225     04/21/20 1000  remdesivir 100 mg in sodium chloride 0.9 % 100 mL IVPB     Discontinue    "Followed by" Linked Group Details   04/20/20 2223 04/25/20 0959   04/20/20 2230  remdesivir 200 mg in sodium chloride 0.9% 250 mL IVPB       "Followed by" Linked Group Details   04/20/20 2223 04/21/20 0420   04/20/20 2115  cefTRIAXone (ROCEPHIN) 1 g in sodium chloride 0.9 % 100 mL IVPB        04/20/20 2114 04/20/20 2355           Continuous Infusions: . sodium chloride 75 mL/hr at 04/22/20 1100  . cefTRIAXone (ROCEPHIN)  IV Stopped (04/21/20 2330)  .  potassium PHOSPHATE IVPB (in mmol) 85 mL/hr at 04/22/20 1100  . remdesivir 100 mg in NS 100 mL Stopped (04/22/20 1021)     Objective: Vitals:   04/21/20 2130 04/21/20 2242 04/22/20 0215 04/22/20 0437  BP: (!) 157/81 (!) 149/70 (!) 146/77 (!) 130/55  Pulse: (!) 57 60 (!) 51 (!) 55  Resp: (!) 24 17 18 19   Temp:  16.1 F (36.7 C) 98 F (36.7 C) 98.1 F (36.7 C)  TempSrc:  Oral Oral Oral  SpO2: 100% 97% 96% 98%  Weight:      Height:        Intake/Output Summary (Last 24 hours) at 04/22/2020 1210 Last data filed at 04/22/2020 1100 Gross per 24 hour  Intake 2670.5 ml  Output --  Net 2670.5 ml   Filed Weights   04/20/20 1815  Weight: 64.4 kg    Examination:   .Awake Alert, Oriented X 3, extremely frail, weak no new F.N deficits, Normal  affect Symmetrical Chest wall movement, Good air movement bilaterally, CTAB RRR,No Gallops,Rubs or new Murmurs, No Parasternal Heave +ve B.Sounds, Abd Soft, No tenderness, No rebound - guarding or rigidity. No Cyanosis, Clubbing or edema, No new Rash or bruise     CBC: Recent Labs  Lab 04/20/20 1837 04/21/20 0840 04/22/20 0116  WBC 3.8* 3.5* 4.5  NEUTROABS  --  1.7 2.9  HGB 14.0 13.1 12.4  HCT 41.5 38.6 36.7  MCV 83.2 85.8 85.2  PLT 148* 136* 096*   Basic Metabolic Panel: Recent Labs  Lab 04/20/20 1837 04/20/20 2101 04/21/20 0840 04/22/20 0116  NA 134*  --  142 141  K 2.6*  --  3.1* 3.5  CL 101  --  110 109  CO2 21*  --  22 21*  GLUCOSE 107*  --  98 92  BUN 11  --  9 10  CREATININE 0.92  --  0.90 0.81  CALCIUM 8.5*  --  8.2* 8.2*  MG  --  1.8 2.0 1.8  PHOS  --   --  2.0* 1.8*   GFR: Estimated Creatinine Clearance: 49.8 mL/min (by C-G formula based on SCr of 0.81 mg/dL). Liver Function Tests: Recent Labs  Lab 04/20/20 2101 04/21/20 0840 04/22/20 0116  AST 28 21 23   ALT 19 16 16   ALKPHOS 66 52 56  BILITOT 1.1 0.4 0.3  PROT 7.2 5.7* 5.5*  ALBUMIN 3.6 2.8* 2.7*   No results for input(s): LIPASE, AMYLASE in the last 168 hours. No results for input(s): AMMONIA in the last 168 hours. Coagulation Profile: No results for input(s): INR, PROTIME in the last 168 hours. Cardiac Enzymes: No results for input(s): CKTOTAL, CKMB, CKMBINDEX, TROPONINI in the last 168 hours. BNP (last 3 results) No results for input(s): PROBNP in the last 8760 hours. HbA1C: No results for input(s): HGBA1C in the last 72 hours. CBG: No results for input(s): GLUCAP in the last 168 hours. Lipid Profile: Recent Labs    04/20/20 2103  TRIG 146   Thyroid Function Tests: No results for input(s): TSH, T4TOTAL, FREET4, T3FREE, THYROIDAB in the last 72 hours. Anemia Panel: Recent Labs    04/21/20 0840 04/22/20 0116  FERRITIN 222 250   Sepsis Labs: Recent Labs  Lab 04/20/20 2101  04/20/20 2103 04/21/20 0000 04/21/20 0840  PROCALCITON 1.48  --   --   --   LATICACIDVEN  --  2.2* 1.6 1.4    Recent Results (from the past 240 hour(s))  SARS Coronavirus 2 by RT PCR (  hospital order, performed in [facility] hospital lab) Nasopharyngeal Nasopharyngeal Swab     Status: Abnormal   Collection Time: 04/20/20  6:40 PM   Specimen: Nasopharyngeal Swab  Result Value Ref Range Status   SARS Coronavirus 2 POSITIVE (A) NEGATIVE Final    Comment: RESULT CALLED TO, READ BACK BY AND VERIFIED WITH: K [facility] RN 2039 04/20/20 A BROWNING (NOTE) SARS-CoV-2 target nucleic acids are DETECTED  SARS-CoV-2 RNA is generally detectable in upper respiratory specimens  during the acute phase of infection.  Positive results are indicative  of the presence of the identified virus, but do not rule out bacterial infection or co-infection with other pathogens not detected by the test.  Clinical correlation with patient history and  other diagnostic information is necessary to determine patient infection status.  The expected result is negative.  Fact Sheet for Patients:   StrictlyIdeas.no   Fact Sheet for Healthcare Providers:   BankingDealers.co.za    This test is not yet approved or cleared by the [place] FDA and  has been authorized for detection and/or diagnosis of SARS-CoV-2 by FDA under an Emergency Use Authorization (EUA).  This EUA will remain in effect (meaning this  test can be used) for the duration of  the COVID-19 declaration under Section 564(b)(1) of the Act, 21 U.S.C. section 360-bbb-3(b)(1), unless the authorization is terminated or revoked sooner.  Performed at [facility], [place] [address] [place], [place] [address]   Blood Culture (routine x 2)     Status: None (Preliminary result)   Collection Time: 04/20/20  9:40 PM   Specimen: BLOOD RIGHT WRIST  Result Value Ref Range Status   Specimen Description BLOOD  RIGHT WRIST  Final   Special Requests   Final    BOTTLES DRAWN AEROBIC AND ANAEROBIC Blood Culture results may not be optimal due to an inadequate volume of blood received in culture bottles   Culture   Final    NO GROWTH 2 DAYS Performed at [facility], [place] [address], [place], [place] [address]    Report Status PENDING  Incomplete  Blood Culture (routine x 2)     Status: None (Preliminary result)   Collection Time: 04/20/20 10:32 PM   Specimen: BLOOD RIGHT HAND  Result Value Ref Range Status   Specimen Description BLOOD RIGHT HAND  Final   Special Requests   Final    BOTTLES DRAWN AEROBIC AND ANAEROBIC Blood Culture results may not be optimal due to an inadequate volume of blood received in culture bottles   Culture   Final    NO GROWTH 2 DAYS Performed at [facility], [place] [address] [place], [place] [address]    Report Status PENDING  Incomplete  Urine Culture     Status: Abnormal   Collection Time: 04/21/20 12:00 AM   Specimen: Urine, Random  Result Value Ref Range Status   Specimen Description URINE, RANDOM  Final   Special Requests   Final    NONE Performed at [facility], [place] [address], [place], [place] [address]    Culture MULTIPLE SPECIES PRESENT, SUGGEST RECOLLECTION (A)  Final   Report Status 04/21/2020 FINAL  Final         Radiology Studies: DG Chest Port 1 View  Result Date: 04/20/2020 CLINICAL DATA:  Weakness. COVID-19 positive. Shortness of breath with exertion. EXAM: PORTABLE CHEST 1 VIEW COMPARISON:  02/20/2015 FINDINGS: Borderline enlarged cardiac silhouette. Tortuous aorta. Clear lungs with normal vascularity.  Cervical spine fixation hardware. IMPRESSION: No acute abnormality. Electronically Signed   By: Claudie Revering M.D.   On: 04/20/2020 21:27        Scheduled Meds: . enoxaparin (LOVENOX) injection  40 mg Subcutaneous Q24H  . sodium chloride flush  3 mL Intravenous Once  . sodium chloride flush  3 mL Intravenous Q12H    Continuous Infusions: . sodium chloride 75 mL/hr at 04/22/20 1100  . cefTRIAXone (ROCEPHIN)  IV Stopped (04/21/20 2330)  . potassium PHOSPHATE IVPB (in mmol) 85 mL/hr at 04/22/20 1100  . remdesivir 100 mg in NS 100 mL Stopped (04/22/20 1021)     LOS: 1 day     Phillips Climes, MD [facility]  If 7PM-7AM, please contact night-coverage [email or web address] Password [facility] 04/22/2020, 12:10 PM
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
## 2020-04-23 LAB — CBC WITH DIFFERENTIAL/PLATELET
Abs Immature Granulocytes: 0.01 10*3/uL (ref 0.00–0.07)
Basophils Absolute: 0 10*3/uL (ref 0.0–0.1)
Basophils Relative: 1 %
Eosinophils Absolute: 0.1 10*3/uL (ref 0.0–0.5)
Eosinophils Relative: 2 %
HCT: 36 % (ref 36.0–46.0)
Hemoglobin: 11.7 g/dL — ABNORMAL LOW (ref 12.0–15.0)
Immature Granulocytes: 0 %
Lymphocytes Relative: 26 %
Lymphs Abs: 1.1 10*3/uL (ref 0.7–4.0)
MCH: 27.8 pg (ref 26.0–34.0)
MCHC: 32.5 g/dL (ref 30.0–36.0)
MCV: 85.5 fL (ref 80.0–100.0)
Monocytes Absolute: 0.6 10*3/uL (ref 0.1–1.0)
Monocytes Relative: 14 %
Neutro Abs: 2.4 10*3/uL (ref 1.7–7.7)
Neutrophils Relative %: 57 %
Platelets: 159 10*3/uL (ref 150–400)
RBC: 4.21 MIL/uL (ref 3.87–5.11)
RDW: 12.4 % (ref 11.5–15.5)
WBC: 4.2 10*3/uL (ref 4.0–10.5)
nRBC: 0 % (ref 0.0–0.2)

## 2020-04-23 LAB — COMPREHENSIVE METABOLIC PANEL
ALT: 15 U/L (ref 0–44)
AST: 17 U/L (ref 15–41)
Albumin: 2.5 g/dL — ABNORMAL LOW (ref 3.5–5.0)
Alkaline Phosphatase: 57 U/L (ref 38–126)
Anion gap: 8 (ref 5–15)
BUN: 11 mg/dL (ref 8–23)
CO2: 24 mmol/L (ref 22–32)
Calcium: 8.5 mg/dL — ABNORMAL LOW (ref 8.9–10.3)
Chloride: 110 mmol/L (ref 98–111)
Creatinine, Ser: 0.84 mg/dL (ref 0.44–1.00)
GFR calc Af Amer: >60 mL/min (ref >60)
GFR calc non Af Amer: >60 mL/min (ref >60)
Glucose, Bld: 90 mg/dL (ref 70–99)
Potassium: 4.1 mmol/L (ref 3.5–5.1)
Sodium: 142 mmol/L (ref 135–145)
Total Bilirubin: 0.5 mg/dL (ref 0.3–1.2)
Total Protein: 5.1 g/dL — ABNORMAL LOW (ref 6.5–8.1)

## 2020-04-23 LAB — D-DIMER, QUANTITATIVE: D-Dimer, Quant: 0.38 ug/mL-FEU (ref 0.00–0.50)

## 2020-04-23 LAB — C-REACTIVE PROTEIN: CRP: 0.9 mg/dL (ref <1.0)

## 2020-04-23 LAB — MAGNESIUM: Magnesium: 1.8 mg/dL (ref 1.7–2.4)

## 2020-04-23 LAB — FERRITIN: Ferritin: 294 ng/mL (ref 11–307)

## 2020-04-23 LAB — PHOSPHORUS: Phosphorus: 2.9 mg/dL (ref 2.5–4.6)

## 2020-04-23 NOTE — Progress Notes (Signed)
PROGRESS NOTE    Ashley Black  ZOX:[id number] DOB: 1941-08-06 DOA: 04/20/2020 PCP: Harlan Stains, MD     Brief Narrative:   79 y.o. WF PMHx HLD, bradycardia, dyslipidemia,    Presented with   Decreased Po intake, no energy for the pst 2 wks, no fever no SOB No cough has been fully vaccinated. Patient has been somewhat confused.  He husband also been sick for the past 2 wks with similar symptoms   Subjective:  Patient does report generalized weakness, and very poor appetite as discussed with staff, she denies any dyspnea .  Assessment & Plan: Covid vaccination; positive vaccination (breakthrough)   Active Problems:   Dyslipidemia   Bradycardia   COVID-19 virus infection   Hypokalemia   Acute lower UTI   Dehydration   Gastroenteritis due to COVID-19 virus   Covid 19 infection with gastroenteritis -Patient is vaccinated, this is breakthrough infection . -She has no respiratory symptoms, main presentation is GI symptoms, including poor appetite, diarrhea, nausea . -Oral intake remains significantly poor, she was encouraged to drink Ensure, will consult nutritionist given very poor appetite . -Continue with IV fluids, monitor electrolytes and replete closely . -Continue with regular diet . -Continue with IV remdesivir .  So far no hypoxia, no indication for steroids.   COVID-19 Labs  Recent Labs    04/20/20 2101 04/20/20 2101 04/21/20 0840 04/22/20 0116 04/23/20 0841  DDIMER 0.47   < > <0.27 0.31 0.38  FERRITIN 290   < > 222 250 294  LDH 247*  --   --   --   --   CRP 1.7*   < > 0.9 0.6 0.9   < > = values in this interval not displayed.    Lab Results  Component Value Date   SARSCOV2NAA POSITIVE (A) 04/20/2020    Dehydration -Secondary to diarrhea and anorexia. -Hydrate normal saline 71ml/hr  Hypokalemia/hypophosphatemia -Repleted  UTI -Urinalysis on admission consistent with UTI. -Empirically treat.  Finished 3 days treatment course.   unfortunately urine culture is unhelpful as growing multiple species.   DVT prophylaxis: Lovenox Code Status: Full Family Communication: D/W daugther via phone Status is: Inpatient    Dispo: The patient is from: Home              Anticipated d/c is to: Extremely weak, unclear, SNF versus home with home health              Anticipated d/c date is: 8/14              Patient currently unstable      Consultants: none   Procedures/Significant Events:    I have personally reviewed and interpreted all radiology studies and my findings are as above.   Cultures   Antimicrobials: Anti-infectives (From admission, onward)   Start     Ordered Stop   04/21/20 2100  cefTRIAXone (ROCEPHIN) 1 g in sodium chloride 0.9 % 100 mL IVPB     Discontinue     04/20/20 2225     04/21/20 1000  remdesivir 100 mg in sodium chloride 0.9 % 100 mL IVPB     Discontinue    "Followed by" Linked Group Details   04/20/20 2223 04/25/20 0959   04/20/20 2230  remdesivir 200 mg in sodium chloride 0.9% 250 mL IVPB       "Followed by" Linked Group Details   04/20/20 2223 04/21/20 0420   04/20/20 2115  cefTRIAXone (ROCEPHIN) 1 g in sodium chloride 0.9 %  100 mL IVPB        04/20/20 2114 04/20/20 2355           Continuous Infusions: . sodium chloride 75 mL/hr at 04/23/20 0900  . remdesivir 100 mg in NS 100 mL 100 mg (04/23/20 1008)     Objective: Vitals:   04/22/20 1300 04/22/20 2110 04/23/20 0500 04/23/20 1416  BP: 124/63 (!) 150/72 (!) 144/67 130/73  Pulse: (!) 55 (!) 56 63 60  Resp: 19 (!) 22 19 19   Temp: 98 F (36.7 C) 97.9 F (36.6 C) 98.5 F (36.9 C) 98.1 F (36.7 C)  TempSrc: Oral Oral Oral Oral  SpO2: 100% 100% 96% 99%  Weight:      Height:        Intake/Output Summary (Last 24 hours) at 04/23/2020 1433 Last data filed at 04/23/2020 0900 Gross per 24 hour  Intake 2072.91 ml  Output 300 ml  Net 1772.91 ml   Filed Weights   04/20/20 1815  Weight: 64.4 kg     Examination:   Awake Alert, Oriented X 3, is extremely weak and frail. Symmetrical Chest wall movement, Good air movement bilaterally, CTAB RRR,No Gallops,Rubs or new Murmurs, No Parasternal Heave +ve B.Sounds, Abd Soft, No tenderness, No rebound - guarding or rigidity. No Cyanosis, Clubbing or edema, No new Rash or bruise    CBC: Recent Labs  Lab 04/20/20 1837 04/21/20 0840 04/22/20 0116 04/23/20 0841  WBC 3.8* 3.5* 4.5 4.2  NEUTROABS  --  1.7 2.9 2.4  HGB 14.0 13.1 12.4 11.7*  HCT 41.5 38.6 36.7 36.0  MCV 83.2 85.8 85.2 85.5  PLT 148* 136* 146* 016   Basic Metabolic Panel: Recent Labs  Lab 04/20/20 1837 04/20/20 2101 04/21/20 0840 04/22/20 0116 04/23/20 0841  NA 134*  --  142 141 142  K 2.6*  --  3.1* 3.5 4.1  CL 101  --  110 109 110  CO2 21*  --  22 21* 24  GLUCOSE 107*  --  98 92 90  BUN 11  --  9 10 11   CREATININE 0.92  --  0.90 0.81 0.84  CALCIUM 8.5*  --  8.2* 8.2* 8.5*  MG  --  1.8 2.0 1.8 1.8  PHOS  --   --  2.0* 1.8* 2.9   GFR: Estimated Creatinine Clearance: 48.1 mL/min (by C-G formula based on SCr of 0.84 mg/dL). Liver Function Tests: Recent Labs  Lab 04/20/20 2101 04/21/20 0840 04/22/20 0116 04/23/20 0841  AST 28 21 23 17   ALT 19 16 16 15   ALKPHOS 66 52 56 57  BILITOT 1.1 0.4 0.3 0.5  PROT 7.2 5.7* 5.5* 5.1*  ALBUMIN 3.6 2.8* 2.7* 2.5*   No results for input(s): LIPASE, AMYLASE in the last 168 hours. No results for input(s): AMMONIA in the last 168 hours. Coagulation Profile: No results for input(s): INR, PROTIME in the last 168 hours. Cardiac Enzymes: No results for input(s): CKTOTAL, CKMB, CKMBINDEX, TROPONINI in the last 168 hours. BNP (last 3 results) No results for input(s): PROBNP in the last 8760 hours. HbA1C: No results for input(s): HGBA1C in the last 72 hours. CBG: No results for input(s): GLUCAP in the last 168 hours. Lipid Profile: Recent Labs    04/20/20 2103  TRIG 146   Thyroid Function Tests: No results for  input(s): TSH, T4TOTAL, FREET4, T3FREE, THYROIDAB in the last 72 hours. Anemia Panel: Recent Labs    04/22/20 0116 04/23/20 0841  FERRITIN 250 294   Sepsis Labs:  Recent Labs  Lab 04/20/20 2101 04/20/20 2103 04/21/20 0000 04/21/20 0840  PROCALCITON 1.48  --   --   --   LATICACIDVEN  --  2.2* 1.6 1.4    Recent Results (from the past 240 hour(s))  SARS Coronavirus 2 by RT PCR (hospital order, performed in [facility] hospital lab) Nasopharyngeal Nasopharyngeal Swab     Status: Abnormal   Collection Time: 04/20/20  6:40 PM   Specimen: Nasopharyngeal Swab  Result Value Ref Range Status   SARS Coronavirus 2 POSITIVE (A) NEGATIVE Final    Comment: RESULT CALLED TO, READ BACK BY AND VERIFIED WITH: K [facility] RN 2039 04/20/20 A BROWNING (NOTE) SARS-CoV-2 target nucleic acids are DETECTED  SARS-CoV-2 RNA is generally detectable in upper respiratory specimens  during the acute phase of infection.  Positive results are indicative  of the presence of the identified virus, but do not rule out bacterial infection or co-infection with other pathogens not detected by the test.  Clinical correlation with patient history and  other diagnostic information is necessary to determine patient infection status.  The expected result is negative.  Fact Sheet for Patients:   StrictlyIdeas.no   Fact Sheet for Healthcare Providers:   BankingDealers.co.za    This test is not yet approved or cleared by the [place] FDA and  has been authorized for detection and/or diagnosis of SARS-CoV-2 by FDA under an Emergency Use Authorization (EUA).  This EUA will remain in effect (meaning this  test can be used) for the duration of  the COVID-19 declaration under Section 564(b)(1) of the Act, 21 U.S.C. section 360-bbb-3(b)(1), unless the authorization is terminated or revoked sooner.  Performed at [facility], [place] [address], [place],  [place] [address]   Blood Culture (routine x 2)     Status: None (Preliminary result)   Collection Time: 04/20/20  9:40 PM   Specimen: BLOOD RIGHT WRIST  Result Value Ref Range Status   Specimen Description BLOOD RIGHT WRIST  Final   Special Requests   Final    BOTTLES DRAWN AEROBIC AND ANAEROBIC Blood Culture results may not be optimal due to an inadequate volume of blood received in culture bottles   Culture   Final    NO GROWTH 3 DAYS Performed at [facility], [place] [address], [place], [place] [address]    Report Status PENDING  Incomplete  Blood Culture (routine x 2)     Status: None (Preliminary result)   Collection Time: 04/20/20 10:32 PM   Specimen: BLOOD RIGHT HAND  Result Value Ref Range Status   Specimen Description BLOOD RIGHT HAND  Final   Special Requests   Final    BOTTLES DRAWN AEROBIC AND ANAEROBIC Blood Culture results may not be optimal due to an inadequate volume of blood received in culture bottles   Culture   Final    NO GROWTH 3 DAYS Performed at [facility], [place] [address], [place], [place] [address]    Report Status PENDING  Incomplete  Urine Culture     Status: Abnormal   Collection Time: 04/21/20 12:00 AM   Specimen: Urine, Random  Result Value Ref Range Status   Specimen Description URINE, RANDOM  Final   Special Requests   Final    NONE Performed at [facility], [place] [address] [place], [place] [address]    Culture MULTIPLE SPECIES PRESENT, SUGGEST RECOLLECTION (A)  Final   Report Status 04/21/2020 FINAL  Final  Radiology Studies: No results found.      Scheduled Meds: . enoxaparin (LOVENOX) injection  40 mg Subcutaneous Q24H  . feeding supplement (ENSURE ENLIVE)  237 mL Oral BID BM  . pantoprazole  40 mg Oral Daily  . sodium chloride flush  3 mL Intravenous Once  . sodium chloride flush  3 mL Intravenous Q12H   Continuous Infusions: . sodium chloride 75 mL/hr at 04/23/20 0900  . remdesivir 100 mg in NS 100 mL  100 mg (04/23/20 1008)     LOS: 2 days     Phillips Climes, MD [facility]  If 7PM-7AM, please contact night-coverage [email or web address] Password [facility] 04/23/2020, 2:33 PM
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
## 2020-04-24 LAB — MAGNESIUM: Magnesium: 1.8 mg/dL (ref 1.7–2.4)

## 2020-04-24 LAB — CBC WITH DIFFERENTIAL/PLATELET
Abs Immature Granulocytes: 0.02 10*3/uL (ref 0.00–0.07)
Basophils Absolute: 0 10*3/uL (ref 0.0–0.1)
Basophils Relative: 0 %
Eosinophils Absolute: 0.1 10*3/uL (ref 0.0–0.5)
Eosinophils Relative: 2 %
HCT: 37.4 % (ref 36.0–46.0)
Hemoglobin: 12.3 g/dL (ref 12.0–15.0)
Immature Granulocytes: 0 %
Lymphocytes Relative: 30 %
Lymphs Abs: 1.4 10*3/uL (ref 0.7–4.0)
MCH: 27.9 pg (ref 26.0–34.0)
MCHC: 32.9 g/dL (ref 30.0–36.0)
MCV: 84.8 fL (ref 80.0–100.0)
Monocytes Absolute: 0.6 10*3/uL (ref 0.1–1.0)
Monocytes Relative: 13 %
Neutro Abs: 2.5 10*3/uL (ref 1.7–7.7)
Neutrophils Relative %: 55 %
Platelets: 156 10*3/uL (ref 150–400)
RBC: 4.41 MIL/uL (ref 3.87–5.11)
RDW: 12.3 % (ref 11.5–15.5)
WBC: 4.7 10*3/uL (ref 4.0–10.5)
nRBC: 0 % (ref 0.0–0.2)

## 2020-04-24 LAB — COMPREHENSIVE METABOLIC PANEL
ALT: 16 U/L (ref 0–44)
AST: 22 U/L (ref 15–41)
Albumin: 2.5 g/dL — ABNORMAL LOW (ref 3.5–5.0)
Alkaline Phosphatase: 61 U/L (ref 38–126)
Anion gap: 11 (ref 5–15)
BUN: 14 mg/dL (ref 8–23)
CO2: 20 mmol/L — ABNORMAL LOW (ref 22–32)
Calcium: 8.7 mg/dL — ABNORMAL LOW (ref 8.9–10.3)
Chloride: 110 mmol/L (ref 98–111)
Creatinine, Ser: 0.92 mg/dL (ref 0.44–1.00)
GFR calc Af Amer: >60 mL/min (ref >60)
GFR calc non Af Amer: 59 mL/min — ABNORMAL LOW (ref >60)
Glucose, Bld: 89 mg/dL (ref 70–99)
Potassium: 4.2 mmol/L (ref 3.5–5.1)
Sodium: 141 mmol/L (ref 135–145)
Total Bilirubin: 0.9 mg/dL (ref 0.3–1.2)
Total Protein: 5.2 g/dL — ABNORMAL LOW (ref 6.5–8.1)

## 2020-04-24 LAB — PHOSPHORUS: Phosphorus: 2.5 mg/dL (ref 2.5–4.6)

## 2020-04-24 LAB — TSH: TSH: 1.012 u[IU]/mL (ref 0.350–4.500)

## 2020-04-24 LAB — C-REACTIVE PROTEIN: CRP: 0.9 mg/dL (ref <1.0)

## 2020-04-24 LAB — FOLATE: Folate: 11.7 ng/mL (ref >5.9)

## 2020-04-24 LAB — D-DIMER, QUANTITATIVE: D-Dimer, Quant: 0.38 ug/mL-FEU (ref 0.00–0.50)

## 2020-04-24 LAB — VITAMIN B12: Vitamin B-12: 4284 pg/mL — ABNORMAL HIGH (ref 180–914)

## 2020-04-24 LAB — FERRITIN: Ferritin: 231 ng/mL (ref 11–307)

## 2020-04-24 MED ORDER — DIPHENHYDRAMINE-ZINC ACETATE 2-0.1 % EX CREA
TOPICAL_CREAM | Freq: Two times a day (BID) | CUTANEOUS | Status: DC | PRN
  Filled 2020-04-24: qty 28

## 2020-04-24 MED ORDER — ENSURE ENLIVE PO LIQD
237.0000 mL | Freq: Three times a day (TID) | ORAL | Status: DC
  Administered 2020-04-24 – 2020-04-25 (×3): 237 mL via ORAL

## 2020-04-24 MED ORDER — DIPHENHYDRAMINE HCL 25 MG PO CAPS
25.0000 mg | ORAL_CAPSULE | Freq: Three times a day (TID) | ORAL | Status: DC | PRN
  Administered 2020-04-24: 25 mg via ORAL
  Filled 2020-04-24: qty 1

## 2020-04-24 NOTE — TOC Initial Note (Signed)
Transition of Care [facility]) - Initial/Assessment Note    Patient Details  Name: Ashley Black MRN: [id number] Date of Birth: 11/22/1940  Transition of Care [facility]) CM/SW Contact:    Benard Halsted, LCSW Phone Number: 04/24/2020, 3:24 PM  Clinical Narrative:                 CSW received consult for possible home health services at time of discharge. CSW spoke with patient regarding PT recommendation of Home Health PT at time of discharge and to make sure patient has support at home. Patient reported that she would like home health services and she does not want to go to a SNF. She stated her daughter would be staying with them for the next month and she is a Marine scientist. CSW confirmed PCP and address with patient. No further questions reported at this time. CSW to continue to follow and assist with discharge planning needs.   Expected Discharge Plan: [place] Barriers to Discharge: Continued Medical Work up   Patient Goals and CMS Choice Patient states their goals for this hospitalization and ongoing recovery are:: Return home CMS Medicare.gov Compare Post Acute Care list provided to:: Patient Choice offered to / list presented to : Patient  Expected Discharge Plan and Services Expected Discharge Plan: [place] In-house Referral: Clinical Social Work Discharge Planning Services: CM Consult Post Acute Care Choice: [place] arrangements for the past 2 months: Single Family Home                                      Prior Living Arrangements/Services Living arrangements for the past 2 months: Single Family Home Lives with:: Spouse Patient language and need for interpreter reviewed:: Yes Do you feel safe going back to the place where you live?: Yes      Need for Family Participation in Patient Care: Yes (Comment) Care giver support system in place?: Yes (comment)   Criminal Activity/Legal Involvement Pertinent to Current  Situation/Hospitalization: No - Comment as needed  Activities of Daily Living Home Assistive Devices/Equipment: None ADL Screening (condition at time of admission) Patient's cognitive ability adequate to safely complete daily activities?: Yes Is the patient deaf or have difficulty hearing?: No Does the patient have difficulty seeing, even when wearing glasses/contacts?: No Does the patient have difficulty concentrating, remembering, or making decisions?: Yes Patient able to express need for assistance with ADLs?: Yes Does the patient have difficulty dressing or bathing?: No Independently performs ADLs?: Yes (appropriate for developmental age) Does the patient have difficulty walking or climbing stairs?: Yes Weakness of Legs: None Weakness of Arms/Hands: None  Permission Sought/Granted Permission sought to share information with : Facility Art therapist granted to share information with : Yes, Verbal Permission Granted     Permission granted to share info w AGENCY: [facility]        Emotional Assessment   Attitude/Demeanor/Rapport: Gracious Affect (typically observed): Accepting, Appropriate Orientation: : Oriented to Self, Oriented to Place, Oriented to  Time, Oriented to Situation Alcohol / Substance Use: Not Applicable Psych Involvement: No (comment)  Admission diagnosis:  Hypokalemia [E87.6] Acute cystitis without hematuria [N30.00] Hypotension due to hypovolemia [I95.89, E86.1] Gastroenteritis due to COVID-19 virus [U07.1, A08.39] COVID-19 virus infection [U07.1] COVID-19 [U07.1] Patient Active Problem List   Diagnosis Date Noted  . Gastroenteritis due to COVID-19 virus 04/21/2020  . COVID-19 virus infection 04/20/2020  .  Hypokalemia 04/20/2020  . Acute lower UTI 04/20/2020  . Dehydration 04/20/2020  . Dyslipidemia   . Edema of both legs   . Dizziness   . Bradycardia    PCP:  Harlan Stains, MD Pharmacy:   [id number] - [place], [place] [address] [place] [address] Phone: [phone] Fax: [phone]     Social Determinants of Health (SDOH) Interventions    Readmission Risk Interventions No flowsheet data found.

## 2020-04-24 NOTE — Progress Notes (Addendum)
PROGRESS NOTE    Ashley Black  GHW:[id number] DOB: August 16, 1941 DOA: 04/20/2020 PCP: Harlan Stains, MD     Brief Narrative:   79 y.o. WF PMHx HLD, bradycardia, dyslipidemia,    Presented with   Decreased Po intake, no energy for the pst 2 wks, no fever no SOB No cough has been fully vaccinated. Patient has been somewhat confused.  He husband also been sick for the past 2 wks with similar symptoms   Subjective:  Still reports generalized weakness, and poor appetite, was able to sit in the chair earlier today .  Assessment & Plan: Covid vaccination; positive vaccination (breakthrough)   Active Problems:   Dyslipidemia   Bradycardia   COVID-19 virus infection   Hypokalemia   Acute lower UTI   Dehydration   Gastroenteritis due to COVID-19 virus   Covid 19 infection with gastroenteritis -Patient is vaccinated, this is breakthrough infection . -She has no respiratory symptoms, main presentation is GI symptoms, including poor appetite, diarrhea, nausea . -Remains with poor oral intake, but it is improving, continue with supplements, nutritionist consulted . -Continue with IV fluids, monitor electrolytes and replete closely . -Continue with regular diet . -Continue with IV remdesivir .  So far no hypoxia, no indication for steroids.   COVID-19 Labs  Recent Labs    04/22/20 0116 04/23/20 0841 04/24/20 0431  DDIMER 0.31 0.38 0.38  FERRITIN 250 294 231  CRP 0.6 0.9 0.9    Lab Results  Component Value Date   SARSCOV2NAA POSITIVE (A) 04/20/2020    Dehydration -Secondary to diarrhea and anorexia. -Continue with IV fluids  Sinus bradycardia -Asymptomatic, appears to be chronic once discussed with the daughter, she has no heart block or pauses, symptomatic, no indication for intervention at this point.  Hypokalemia/hypophosphatemia -Repleted  UTI -Treated with Rocephin x3 days   DVT prophylaxis: Lovenox Code Status: Full Family Communication: D/W daugther  via phone 8/12 and 8/13 Status is: Inpatient    Dispo: The patient is from: Home              Anticipated d/c is to: Home with [facility]              Anticipated d/c date is: 8/14              Patient currently unstable, appetite remains very poor, she remains on IV fluid.      Consultants: none   Procedures/Significant Events:    I have personally reviewed and interpreted all radiology studies and my findings are as above.   Cultures   Antimicrobials: Anti-infectives (From admission, onward)   Start     Ordered Stop   04/21/20 2100  cefTRIAXone (ROCEPHIN) 1 g in sodium chloride 0.9 % 100 mL IVPB     Discontinue     04/20/20 2225     04/21/20 1000  remdesivir 100 mg in sodium chloride 0.9 % 100 mL IVPB     Discontinue    "Followed by" Linked Group Details   04/20/20 2223 04/25/20 0959   04/20/20 2230  remdesivir 200 mg in sodium chloride 0.9% 250 mL IVPB       "Followed by" Linked Group Details   04/20/20 2223 04/21/20 0420   04/20/20 2115  cefTRIAXone (ROCEPHIN) 1 g in sodium chloride 0.9 % 100 mL IVPB        04/20/20 2114 04/20/20 2355           Continuous Infusions: . sodium chloride 50 mL/hr at 04/23/20 2004  Objective: Vitals:   04/23/20 0500 04/23/20 1416 04/23/20 2124 04/24/20 0457  BP: (!) 144/67 130/73 133/76 140/80  Pulse: 63 60 (!) 54 (!) 51  Resp: 19 19 12 19   Temp: 98.5 F (36.9 C) 98.1 F (36.7 C) 98.6 F (37 C) 99.1 F (37.3 C)  TempSrc: Oral Oral Oral Oral  SpO2: 96% 99% 100% 91%  Weight:      Height:        Intake/Output Summary (Last 24 hours) at 04/24/2020 1233 Last data filed at 04/24/2020 6599 Gross per 24 hour  Intake 172.04 ml  Output 500 ml  Net -327.96 ml   Filed Weights   04/20/20 1815  Weight: 64.4 kg    Examination:   Awake Alert, Oriented X 3, she is extremely frail and deconditioned, no new F.N deficits, Normal affect Symmetrical Chest wall movement, Good air movement bilaterally, CTAB RRR,No Gallops,Rubs or  new Murmurs, No Parasternal Heave +ve B.Sounds, Abd Soft, No tenderness, No rebound - guarding or rigidity. No Cyanosis, Clubbing or edema, No new Rash or bruise     CBC: Recent Labs  Lab 04/20/20 1837 04/21/20 0840 04/22/20 0116 04/23/20 0841 04/24/20 0431  WBC 3.8* 3.5* 4.5 4.2 4.7  NEUTROABS  --  1.7 2.9 2.4 2.5  HGB 14.0 13.1 12.4 11.7* 12.3  HCT 41.5 38.6 36.7 36.0 37.4  MCV 83.2 85.8 85.2 85.5 84.8  PLT 148* 136* 146* 159 357   Basic Metabolic Panel: Recent Labs  Lab 04/20/20 1837 04/20/20 2101 04/21/20 0840 04/22/20 0116 04/23/20 0841 04/24/20 0431  NA 134*  --  142 141 142 141  K 2.6*  --  3.1* 3.5 4.1 4.2  CL 101  --  110 109 110 110  CO2 21*  --  22 21* 24 20*  GLUCOSE 107*  --  98 92 90 89  BUN 11  --  9 10 11 14   CREATININE 0.92  --  0.90 0.81 0.84 0.92  CALCIUM 8.5*  --  8.2* 8.2* 8.5* 8.7*  MG  --  1.8 2.0 1.8 1.8 1.8  PHOS  --   --  2.0* 1.8* 2.9 2.5   GFR: Estimated Creatinine Clearance: 43.9 mL/min (by C-G formula based on SCr of 0.92 mg/dL). Liver Function Tests: Recent Labs  Lab 04/20/20 2101 04/21/20 0840 04/22/20 0116 04/23/20 0841 04/24/20 0431  AST 28 21 23 17 22   ALT 19 16 16 15 16   ALKPHOS 66 52 56 57 61  BILITOT 1.1 0.4 0.3 0.5 0.9  PROT 7.2 5.7* 5.5* 5.1* 5.2*  ALBUMIN 3.6 2.8* 2.7* 2.5* 2.5*   No results for input(s): LIPASE, AMYLASE in the last 168 hours. No results for input(s): AMMONIA in the last 168 hours. Coagulation Profile: No results for input(s): INR, PROTIME in the last 168 hours. Cardiac Enzymes: No results for input(s): CKTOTAL, CKMB, CKMBINDEX, TROPONINI in the last 168 hours. BNP (last 3 results) No results for input(s): PROBNP in the last 8760 hours. HbA1C: No results for input(s): HGBA1C in the last 72 hours. CBG: No results for input(s): GLUCAP in the last 168 hours. Lipid Profile: No results for input(s): CHOL, HDL, LDLCALC, TRIG, CHOLHDL, LDLDIRECT in the last 72 hours. Thyroid Function Tests: No  results for input(s): TSH, T4TOTAL, FREET4, T3FREE, THYROIDAB in the last 72 hours. Anemia Panel: Recent Labs    04/23/20 0841 04/24/20 0431  VITAMINB12  --  4,284*  FOLATE  --  11.7  FERRITIN 294 231   Sepsis Labs: Recent Labs  Lab 04/20/20 2101 04/20/20 2103 04/21/20 0000 04/21/20 0840  PROCALCITON 1.48  --   --   --   LATICACIDVEN  --  2.2* 1.6 1.4    Recent Results (from the past 240 hour(s))  SARS Coronavirus 2 by RT PCR (hospital order, performed in [facility] hospital lab) Nasopharyngeal Nasopharyngeal Swab     Status: Abnormal   Collection Time: 04/20/20  6:40 PM   Specimen: Nasopharyngeal Swab  Result Value Ref Range Status   SARS Coronavirus 2 POSITIVE (A) NEGATIVE Final    Comment: RESULT CALLED TO, READ BACK BY AND VERIFIED WITH: K [facility] RN 2039 04/20/20 A BROWNING (NOTE) SARS-CoV-2 target nucleic acids are DETECTED  SARS-CoV-2 RNA is generally detectable in upper respiratory specimens  during the acute phase of infection.  Positive results are indicative  of the presence of the identified virus, but do not rule out bacterial infection or co-infection with other pathogens not detected by the test.  Clinical correlation with patient history and  other diagnostic information is necessary to determine patient infection status.  The expected result is negative.  Fact Sheet for Patients:   StrictlyIdeas.no   Fact Sheet for Healthcare Providers:   BankingDealers.co.za    This test is not yet approved or cleared by the [place] FDA and  has been authorized for detection and/or diagnosis of SARS-CoV-2 by FDA under an Emergency Use Authorization (EUA).  This EUA will remain in effect (meaning this  test can be used) for the duration of  the COVID-19 declaration under Section 564(b)(1) of the Act, 21 U.S.C. section 360-bbb-3(b)(1), unless the authorization is terminated or revoked sooner.  Performed at [facility], [place] [address], [place], [place] [address]   Blood Culture (routine x 2)     Status: None (Preliminary result)   Collection Time: 04/20/20  9:40 PM   Specimen: BLOOD RIGHT WRIST  Result Value Ref Range Status   Specimen Description BLOOD RIGHT WRIST  Final   Special Requests   Final    BOTTLES DRAWN AEROBIC AND ANAEROBIC Blood Culture results may not be optimal due to an inadequate volume of blood received in culture bottles   Culture   Final    NO GROWTH 4 DAYS Performed at [facility], [place] [address] [place], [place] [address]    Report Status PENDING  Incomplete  Blood Culture (routine x 2)     Status: None (Preliminary result)   Collection Time: 04/20/20 10:32 PM   Specimen: BLOOD RIGHT HAND  Result Value Ref Range Status   Specimen Description BLOOD RIGHT HAND  Final   Special Requests   Final    BOTTLES DRAWN AEROBIC AND ANAEROBIC Blood Culture results may not be optimal due to an inadequate volume of blood received in culture bottles   Culture   Final    NO GROWTH 4 DAYS Performed at [facility], [place] [address] [place], [place] [address]    Report Status PENDING  Incomplete  Urine Culture     Status: Abnormal   Collection Time: 04/21/20 12:00 AM   Specimen: Urine, Random  Result Value Ref Range Status   Specimen Description URINE, RANDOM  Final   Special Requests   Final    NONE Performed at [facility], [place] [address] [place], [place] [address]    Culture MULTIPLE SPECIES PRESENT, SUGGEST RECOLLECTION (A)  Final   Report Status 04/21/2020 FINAL  Final  Radiology Studies: No results found.      Scheduled Meds: . enoxaparin (LOVENOX) injection  40 mg Subcutaneous Q24H  . feeding supplement (ENSURE ENLIVE)  237 mL Oral BID BM  . pantoprazole  40 mg Oral Daily  . sodium chloride flush  3 mL Intravenous Once  . sodium chloride flush  3 mL Intravenous Q12H   Continuous Infusions: . sodium chloride 50 mL/hr at  04/23/20 2004     LOS: 3 days     Phillips Climes, MD [facility]  If 7PM-7AM, please contact night-coverage [email or web address] Password [facility] 04/24/2020, 12:33 PM

## 2020-04-24 NOTE — Progress Notes (Signed)
Initial Nutrition Assessment  DOCUMENTATION CODES:   Not applicable  INTERVENTION:  Provide Ensure Enlive po TID, each supplement provides 350 kcal and 20 grams of protein.  Encourage adequate PO intake.   NUTRITION DIAGNOSIS:   Increased nutrient needs related to acute illness (COVID) as evidenced by estimated needs.  GOAL:   Patient will meet greater than or equal to 90% of their needs  MONITOR:   PO intake, Supplement acceptance, Skin, Weight trends, Labs, I & O's  REASON FOR ASSESSMENT:   Consult Assessment of nutrition requirement/status  ASSESSMENT:   79 y.o. female with medical history significant of HLD, bradycardia. Pt COVID positive, presents with poor appetite, nausea, diarrhea.  RD working remotely. GI symptoms ongoing over the past 2 weeks. Unable to obtain additional nutrition history at this time. Meal completion has been 75%. Pt currently has Ensure ordered and has been consuming them. RD to increase Ensure to TID to aid in caloric and protein needs. Unable to complete Nutrition-Focused physical exam at this time.   Labs and medications reviewed.   Diet Order:   Diet Order            Diet regular Room service appropriate? Yes; Fluid consistency: Thin  Diet effective now                 EDUCATION NEEDS:   Not appropriate for education at this time  Skin:  Skin Assessment: Reviewed RN Assessment  Last BM:  8/11  Height:   Ht Readings from Last 1 Encounters:  04/20/20 5\' 5"  (1.651 m)    Weight:   Wt Readings from Last 1 Encounters:  04/20/20 64.4 kg    BMI:  Body mass index is 23.63 kg/m.  Estimated Nutritional Needs:   Kcal:  7225-7505  Protein:  85-95 grams  Fluid:  >/= 1.8 L/day   Corrin Parker, MS, RD, LDN RD pager number/after hours weekend pager number on Amion.

## 2020-04-24 NOTE — Progress Notes (Signed)
Physical Therapy Treatment Patient Details Name: Ashley Black MRN: [id number] DOB: 1941-05-28 Today's Date: 04/24/2020    History of Present Illness Pt is an 79 y.o. female admitted 04/20/20 with decreased PO intake, generalized weakness, diarrhea; has been fully vaccinated. Pt (+) COVID-19, gastroenteritis. PMH includes bradycardia, dizziness, PNA. Of note, pt's husband also admitted with COVID.   PT Comments    Pt progressing with mobility. Able to increase ambulation distance with RW and intermittent min guard for balance. Remains limited by decreased activity tolerance, generalized weakness and apparent cognitive impairments. Motivated to participate despite feeling so poorly; appreciative of being able to see husband. SpO2 98-100% on RA with ambulation, HR 58-71. Recommend return home with HHPT services if family able to provide initial 24/7 assist as pt at high risk for falls.    Follow Up Recommendations  Home health PT;Supervision/Assistance - 24 hour (SNF if won't have initial 24/7)     Equipment Recommendations  Rolling walker with 5" wheels    Recommendations for Other Services       Precautions / Restrictions Precautions Precautions: Fall Restrictions Weight Bearing Restrictions: No    Mobility  Bed Mobility Overal bed mobility: Needs Assistance Bed Mobility: Supine to Sit     Supine to sit: Supervision;HOB elevated        Transfers Overall transfer level: Needs assistance Equipment used: Rolling walker (2 wheeled) Transfers: Sit to/from Stand Sit to Stand: Supervision         General transfer comment: Able to stand from bed and BSC to RW with supervision, reliant on momentum  Ambulation/Gait Ambulation/Gait assistance: Min guard Gait Distance (Feet): 150 Feet Assistive device: Rolling walker (2 wheeled) Gait Pattern/deviations: Step-through pattern;Decreased stride length;Trunk flexed Gait velocity: Decreased   General Gait Details: Slow, mostly  steady gait with RW and intermittent min guard for balance; improving RW navigation, able to self-correct running wheels into objects which only happened a couple times (much improved compared to prior session). 1x seated rest in husband's room to visit briefly   Stairs             Wheelchair Mobility    Modified Rankin (Stroke Patients Only)       Balance Overall balance assessment: Needs assistance Sitting-balance support: No upper extremity supported Sitting balance-Leahy Scale: Fair     Standing balance support: Single extremity supported;During functional activity Standing balance-Leahy Scale: Fair Standing balance comment: Can static stand without UE support; reliant on UE support for dynamic stability                            Cognition Arousal/Alertness: Awake/alert Behavior During Therapy: WFL for tasks assessed/performed Overall Cognitive Status: No family/caregiver present to determine baseline cognitive functioning Area of Impairment: Attention;Memory;Awareness;Problem solving                   Current Attention Level: Selective Memory: Decreased short-term memory     Awareness: Emergent Problem Solving: Requires verbal cues;Slow processing General Comments: pt easily distracted and requiring verbal cues and redirection. Increased time and processing needed for basic tasks      Exercises Other Exercises Other Exercises: 10x incentive spirometer, frequent cues for technique, pulling [phone] mL with practice - educ on frequency    General Comments General comments (skin integrity, edema, etc.): SpO2 98-100% on RA with ambulation, HR 58-71. Improving motivation this session. Provided incentive spirometer      Pertinent Vitals/Pain Pain Assessment: No/denies pain  Home Living                      Prior Function            PT Goals (current goals can now be found in the care plan section) Progress towards PT goals:  Progressing toward goals    Frequency    Min 3X/week      PT Plan Current plan remains appropriate    Co-evaluation              AM-PAC PT "6 Clicks" Mobility   Outcome Measure  Help needed turning from your back to your side while in a flat bed without using bedrails?: None Help needed moving from lying on your back to sitting on the side of a flat bed without using bedrails?: None Help needed moving to and from a bed to a chair (including a wheelchair)?: A Little Help needed standing up from a chair using your arms (e.g., wheelchair or bedside chair)?: A Little Help needed to walk in hospital room?: A Little Help needed climbing 3-5 steps with a railing? : A Little 6 Click Score: 20    End of Session   Activity Tolerance: Patient tolerated treatment well Patient left: in chair;with call bell/phone within reach;with chair alarm set Nurse Communication: Mobility status PT Visit Diagnosis: Other abnormalities of gait and mobility (R26.89);Muscle weakness (generalized) (M62.81)     Time: 1610-9604 PT Time Calculation (min) (ACUTE ONLY): 27 min  Charges:  $Gait Training: 8-22 mins $Therapeutic Exercise: 8-22 mins                     Mabeline Caras, PT, DPT Acute Rehabilitation Services  Pager [phone] Office [place] 04/24/2020, 1:46 PM

## 2020-04-25 LAB — COMPREHENSIVE METABOLIC PANEL
ALT: 18 U/L (ref 0–44)
AST: 20 U/L (ref 15–41)
Albumin: 2.5 g/dL — ABNORMAL LOW (ref 3.5–5.0)
Alkaline Phosphatase: 66 U/L (ref 38–126)
Anion gap: 6 (ref 5–15)
BUN: 17 mg/dL (ref 8–23)
CO2: 26 mmol/L (ref 22–32)
Calcium: 8.6 mg/dL — ABNORMAL LOW (ref 8.9–10.3)
Chloride: 109 mmol/L (ref 98–111)
Creatinine, Ser: 0.99 mg/dL (ref 0.44–1.00)
GFR calc Af Amer: >60 mL/min (ref >60)
GFR calc non Af Amer: 54 mL/min — ABNORMAL LOW (ref >60)
Glucose, Bld: 110 mg/dL — ABNORMAL HIGH (ref 70–99)
Potassium: 4.2 mmol/L (ref 3.5–5.1)
Sodium: 141 mmol/L (ref 135–145)
Total Bilirubin: 0.6 mg/dL (ref 0.3–1.2)
Total Protein: 5.4 g/dL — ABNORMAL LOW (ref 6.5–8.1)

## 2020-04-25 LAB — CULTURE, BLOOD (ROUTINE X 2)
Culture: NO GROWTH
Culture: NO GROWTH

## 2020-04-25 LAB — CBC WITH DIFFERENTIAL/PLATELET
Abs Immature Granulocytes: 0.01 10*3/uL (ref 0.00–0.07)
Basophils Absolute: 0 10*3/uL (ref 0.0–0.1)
Basophils Relative: 1 %
Eosinophils Absolute: 0.2 10*3/uL (ref 0.0–0.5)
Eosinophils Relative: 3 %
HCT: 38.9 % (ref 36.0–46.0)
Hemoglobin: 12.7 g/dL (ref 12.0–15.0)
Immature Granulocytes: 0 %
Lymphocytes Relative: 29 %
Lymphs Abs: 1.5 10*3/uL (ref 0.7–4.0)
MCH: 28.5 pg (ref 26.0–34.0)
MCHC: 32.6 g/dL (ref 30.0–36.0)
MCV: 87.2 fL (ref 80.0–100.0)
Monocytes Absolute: 0.7 10*3/uL (ref 0.1–1.0)
Monocytes Relative: 13 %
Neutro Abs: 2.8 10*3/uL (ref 1.7–7.7)
Neutrophils Relative %: 54 %
Platelets: 269 10*3/uL (ref 150–400)
RBC: 4.46 MIL/uL (ref 3.87–5.11)
RDW: 12.9 % (ref 11.5–15.5)
WBC: 5.2 10*3/uL (ref 4.0–10.5)
nRBC: 0 % (ref 0.0–0.2)

## 2020-04-25 LAB — C-REACTIVE PROTEIN: CRP: 0.7 mg/dL (ref <1.0)

## 2020-04-25 LAB — MAGNESIUM: Magnesium: 2.1 mg/dL (ref 1.7–2.4)

## 2020-04-25 LAB — FERRITIN: Ferritin: 250 ng/mL (ref 11–307)

## 2020-04-25 LAB — D-DIMER, QUANTITATIVE: D-Dimer, Quant: 0.28 ug/mL-FEU (ref 0.00–0.50)

## 2020-04-25 LAB — PHOSPHORUS: Phosphorus: 2.9 mg/dL (ref 2.5–4.6)

## 2020-04-25 MED ORDER — ENSURE ENLIVE PO LIQD: 237.0000 mL | Freq: Three times a day (TID) | ORAL | 12 refills | Status: DC

## 2020-04-25 NOTE — TOC Transition Note (Signed)
Transition of Care [facility]) - CM/SW Discharge Note   Patient Details  Name: Ashley Black MRN: [id number] Date of Birth: April 06, 1941  Transition of [facility]) CM/SW Contact:  Carles Collet, RN Phone Number: 04/25/2020, 12:39 PM   Clinical Narrative:    [place] services aet up through [facility]. Patient declines DME, states she has transportation home.     Final next level of care: [place] Barriers to Discharge: No Barriers Identified   Patient Goals and CMS Choice Patient states their goals for this hospitalization and ongoing recovery are:: Return home CMS Medicare.gov Compare Post Acute Care list provided to:: Patient Choice offered to / list presented to : Patient  Discharge Placement                       Discharge Plan and Services In-house Referral: Clinical Social Work Discharge Planning Services: CM Consult Post Acute Care Choice: Home Health                    HH Arranged: PT, OT, Nurse's Aide Foreman Agency: [place] ([facility]) Date HH Agency Contacted: 04/25/20 Time [place]: 1238 Representative spoke with at [place]: [place] ([place]) Interventions     Readmission Risk Interventions No flowsheet data found.

## 2020-04-25 NOTE — Discharge Summary (Signed)
Ashley Black, is a 79 y.o. female  DOB 1941/01/17  MRN [id number].  Admission date:  04/20/2020  Admitting Physician  Allie Bossier, MD  Discharge Date:  04/25/2020   Primary MD  Harlan Stains, MD  Recommendations for primary care physician for things to follow:  -Please check CBC, CMP, during next visit.   Admission Diagnosis  Hypokalemia [E87.6] Acute cystitis without hematuria [N30.00] Hypotension due to hypovolemia [I95.89, E86.1] Gastroenteritis due to COVID-19 virus [U07.1, A08.39] COVID-19 virus infection [U07.1] COVID-19 [U07.1]   Discharge Diagnosis  Hypokalemia [E87.6] Acute cystitis without hematuria [N30.00] Hypotension due to hypovolemia [I95.89, E86.1] Gastroenteritis due to COVID-19 virus [U07.1, A08.39] COVID-19 virus infection [U07.1] COVID-19 [U07.1]    Active Problems:   Dyslipidemia   Bradycardia   COVID-19 virus infection   Hypokalemia   Acute lower UTI   Dehydration   Gastroenteritis due to COVID-19 virus      Past Medical History:  Diagnosis Date  . Bradycardia    testing done 2012 no cause found  . Dizziness    brief occasions  . Dyslipidemia   . Edema of both legs    very rare  . History of hysterectomy 1985  . Pneumonia    few times, none recent    Past Surgical History:  Procedure Laterality Date  . ABDOMINAL HYSTERECTOMY      1 ovary removed  . back disc 5-6  1997   upper back  . FLEXIBLE SIGMOIDOSCOPY N/A 06/21/2016   Procedure: FLEXIBLE SIGMOIDOSCOPY;  Surgeon: Garlan Fair, MD;  Location: [facility];  Service: Endoscopy;  Laterality: N/A;       History of present illness and  Hospital Course:     Kindly see H&P for history of present illness and admission details, please review complete Labs, Consult reports and Test reports for all details in brief  HPI  from the history and physical done on the day of admission  04/20/2020  Ashley Black is a 79 y.o. female with medical history significant of HLD, bradycardia    Presented with   Decreased Po intake, no energy for the pst 2 wks, no fever no SOB No cough has been fully vaccinated. Patient has been somewhat confused.  He husband also been sick for the past 2 wks with similar symptoms Infectious risk factors:  Reports  Body aches, severe fatigue    Has  been vaccinated against COVID    Initial COVID TEST    POSITIVE,   in house  PCR testing  [facility] 19 infection with gastroenteritis -Patient is vaccinated, this is breakthrough infection . -She has no respiratory symptoms, no hypoxia, no evidence of pneumonia on imaging, her main presentation is GI symptoms, including poor appetite, diarrhea, nausea, which was significant in the beginning where she was requiring continuous IV fluids, this has gradually improved, her appetite and oral intake has improved, she did improve with physical therapy as well, current recommendation to discharge home with home health, she  was encouraged to drink Ensure. -She was treated with IV remdesivir during hospital stay, there is no hypoxia so is no indication for steroids/  Dehydration -With IV fluids  Sinus bradycardia -Asymptomatic, appears to be chronic once discussed with the daughter, she has no heart block or pauses, symptomatic, no indication for intervention at this point.  Hypokalemia/hypophosphatemia -Repleted  UTI -Treated with Rocephin x3 days     Discharge Condition:  stable   Follow UP   Follow-up Information    Harlan Stains, MD Follow up in 2 week(s).   Specialty: Family Medicine Contact information: [address] [address] [phone]                 Discharge Instructions  and  Discharge Medications     Discharge Instructions    Discharge instructions   Complete by: As directed    Follow with Primary MD  Harlan Stains, MD in 14 days   Get CBC, CMP,  checked  by Primary MD next visit.    Activity: As tolerated with Full fall precautions use walker/cane & assistance as needed   Disposition Home    Diet: Regular diet.   On your next visit with your primary care physician please Get Medicines reviewed and adjusted.   Please request your Prim.MD to go over all Hospital Tests and Procedure/Radiological results at the follow up, please get all Hospital records sent to your Prim MD by signing hospital release before you go home.   If you experience worsening of your admission symptoms, develop shortness of breath, life threatening emergency, suicidal or homicidal thoughts you must seek medical attention immediately by calling 911 or calling your MD immediately  if symptoms less severe.  You Must read complete instructions/literature along with all the possible adverse reactions/side effects for all the Medicines you take and that have been prescribed to you. Take any new Medicines after you have completely understood and accpet all the possible adverse reactions/side effects.   Do not drive, operating heavy machinery, perform activities at heights, swimming or participation in water activities or provide baby sitting services if your were admitted for syncope or siezures until you have seen by Primary MD or a Neurologist and advised to do so again.  Do not drive when taking Pain medications.    Do not take more than prescribed Pain, Sleep and Anxiety Medications  Special Instructions: If you have smoked or chewed Tobacco  in the last 2 yrs please stop smoking, stop any regular Alcohol  and or any Recreational drug use.  Wear Seat belts while driving.   Please note  You were cared for by a hospitalist during your hospital stay. If you have any questions about your discharge medications or the care you received while you were in the hospital after you are discharged, you can call the  unit and asked to speak with the hospitalist on call if the hospitalist that took care of you is not available. Once you are discharged, your primary care physician will handle any further medical issues. Please note that NO REFILLS for any discharge medications will be authorized once you are discharged, as it is imperative that you return to your primary care physician (or establish a relationship with a primary care physician if you do not have one) for your aftercare needs so that they can reassess your need for medications and monitor your lab values.   Increase activity slowly   Complete by: As directed  Allergies as of 04/25/2020   No Known Allergies     Medication List    TAKE these medications   feeding supplement (ENSURE ENLIVE) Liqd Take 237 mLs by mouth 3 (three) times daily between meals.   ICAPS AREDS 2 PO Take 1 tablet by mouth in the morning and at bedtime.   loratadine 10 MG tablet Commonly known as: CLARITIN Take 10 mg by mouth every morning.   potassium chloride 10 MEQ tablet Commonly known as: KLOR-CON Take one tablet by mouth daily.         Diet and Activity recommendation: See Discharge Instructions above   Consults obtained - None   Major procedures and Radiology Reports - PLEASE review detailed and final reports for all details, in brief -      DG Chest Port 1 View  Result Date: 04/20/2020 CLINICAL DATA:  Weakness. COVID-19 positive. Shortness of breath with exertion. EXAM: PORTABLE CHEST 1 VIEW COMPARISON:  02/20/2015 FINDINGS: Borderline enlarged cardiac silhouette. Tortuous aorta. Clear lungs with normal vascularity. Cervical spine fixation hardware. IMPRESSION: No acute abnormality. Electronically Signed   By: Claudie Revering M.D.   On: 04/20/2020 21:27    Micro Results     Recent Results (from the past 240 hour(s))  SARS Coronavirus 2 by RT PCR (hospital order, performed in [facility] hospital lab) Nasopharyngeal Nasopharyngeal Swab      Status: Abnormal   Collection Time: 04/20/20  6:40 PM   Specimen: Nasopharyngeal Swab  Result Value Ref Range Status   SARS Coronavirus 2 POSITIVE (A) NEGATIVE Final    Comment: RESULT CALLED TO, READ BACK BY AND VERIFIED WITH: K [facility] RN 2039 04/20/20 A BROWNING (NOTE) SARS-CoV-2 target nucleic acids are DETECTED  SARS-CoV-2 RNA is generally detectable in upper respiratory specimens  during the acute phase of infection.  Positive results are indicative  of the presence of the identified virus, but do not rule out bacterial infection or co-infection with other pathogens not detected by the test.  Clinical correlation with patient history and  other diagnostic information is necessary to determine patient infection status.  The expected result is negative.  Fact Sheet for Patients:   StrictlyIdeas.no   Fact Sheet for Healthcare Providers:   BankingDealers.co.za    This test is not yet approved or cleared by the [place] FDA and  has been authorized for detection and/or diagnosis of SARS-CoV-2 by FDA under an Emergency Use Authorization (EUA).  This EUA will remain in effect (meaning this  test can be used) for the duration of  the COVID-19 declaration under Section 564(b)(1) of the Act, 21 U.S.C. section 360-bbb-3(b)(1), unless the authorization is terminated or revoked sooner.  Performed at [facility], [place] [address] [place], [place] [address]   Blood Culture (routine x 2)     Status: None (Preliminary result)   Collection Time: 04/20/20  9:40 PM   Specimen: BLOOD RIGHT WRIST  Result Value Ref Range Status   Specimen Description BLOOD RIGHT WRIST  Final   Special Requests   Final    BOTTLES DRAWN AEROBIC AND ANAEROBIC Blood Culture results may not be optimal due to an inadequate volume of blood received in culture bottles   Culture   Final    NO GROWTH 4 DAYS Performed at [facility], [place] [address], [place], [place] [address]    Report Status PENDING  Incomplete  Blood Culture (routine x 2)     Status: None (Preliminary result)  Collection Time: 04/20/20 10:32 PM   Specimen: BLOOD RIGHT HAND  Result Value Ref Range Status   Specimen Description BLOOD RIGHT HAND  Final   Special Requests   Final    BOTTLES DRAWN AEROBIC AND ANAEROBIC Blood Culture results may not be optimal due to an inadequate volume of blood received in culture bottles   Culture   Final    NO GROWTH 4 DAYS Performed at [facility], [place] [address], [place], [place] [address]    Report Status PENDING  Incomplete  Urine Culture     Status: Abnormal   Collection Time: 04/21/20 12:00 AM   Specimen: Urine, Random  Result Value Ref Range Status   Specimen Description URINE, RANDOM  Final   Special Requests   Final    NONE Performed at [facility], [place] [address], [place], [place] [address]    Culture MULTIPLE SPECIES PRESENT, SUGGEST RECOLLECTION (A)  Final   Report Status 04/21/2020 FINAL  Final       Today   Subjective:   Adriona Kaney today has no headache,no chest or abdominal pain, patient reports her appetite has improved and picked up, reports weakness has improved as well.  Objective:   Blood pressure 119/64, pulse (!) 54, temperature 98 F (36.7 C), temperature source Oral, resp. rate 20, height 5\' 5"  (1.651 m), weight 64.4 kg, SpO2 98 %.   Intake/Output Summary (Last 24 hours) at 04/25/2020 1208 Last data filed at 04/25/2020 0914 Gross per 24 hour  Intake 1682.51 ml  Output 500 ml  Net 1182.51 ml    Exam Awake Alert, Oriented x 3, No new F.N deficits, Normal affect Symmetrical Chest wall movement, Good air movement bilaterally, CTAB RRR,No Gallops,Rubs or new Murmurs, No Parasternal Heave +ve B.Sounds, Abd Soft, Non tender, No rebound -guarding or rigidity. No Cyanosis, Clubbing or edema, No new Rash or bruise  Data Review   CBC w Diff:  Lab Results  Component Value  Date   WBC 5.2 04/25/2020   HGB 12.7 04/25/2020   HCT 38.9 04/25/2020   PLT 269 04/25/2020   LYMPHOPCT 29 04/25/2020   MONOPCT 13 04/25/2020   EOSPCT 3 04/25/2020   BASOPCT 1 04/25/2020    CMP:  Lab Results  Component Value Date   NA 141 04/25/2020   K 4.2 04/25/2020   CL 109 04/25/2020   CO2 26 04/25/2020   BUN 17 04/25/2020   CREATININE 0.99 04/25/2020   PROT 5.4 (L) 04/25/2020   ALBUMIN 2.5 (L) 04/25/2020   BILITOT 0.6 04/25/2020   ALKPHOS 66 04/25/2020   AST 20 04/25/2020   ALT 18 04/25/2020  .   Total Time in preparing paper work, data evaluation and todays exam - 59 minutes  Phillips Climes M.D on 04/25/2020 at 12:08 PM  [facility]   Office  [phone]

## 2020-04-25 NOTE — Discharge Instructions (Signed)
Person Under Monitoring Name: Ashley Black  Location: [address] [address]   Infection Prevention Recommendations for Individuals Confirmed to have, or Being Evaluated for, 2019 Novel Coronavirus (COVID-19) Infection Who Receive Care at Home  Individuals who are confirmed to have, or are being evaluated for, COVID-19 should follow the prevention steps below until a healthcare provider or local or state health department says they can return to normal activities.  Stay home except to get medical care You should restrict activities outside your home, except for getting medical care. Do not go to work, school, or public areas, and do not use public transportation or taxis.  Call ahead before visiting your doctor Before your medical appointment, call the healthcare provider and tell them that you have, or are being evaluated for, COVID-19 infection. This will help the healthcare provider's office take steps to keep other people from getting infected. Ask your healthcare provider to call the local or state health department.  Monitor your symptoms Seek prompt medical attention if your illness is worsening (e.g., difficulty breathing). Before going to your medical appointment, call the healthcare provider and tell them that you have, or are being evaluated for, COVID-19 infection. Ask your healthcare provider to call the local or state health department.  Wear a facemask You should wear a facemask that covers your nose and mouth when you are in the same room with other people and when you visit a healthcare provider. People who live with or visit you should also wear a facemask while they are in the same room with you.  Separate yourself from other people in your home As much as possible, you should stay in a different room from other people in your home. Also, you should use a separate bathroom, if available.  Avoid sharing household items You should  not share dishes, drinking glasses, cups, eating utensils, towels, bedding, or other items with other people in your home. After using these items, you should wash them thoroughly with soap and water.  Cover your coughs and sneezes Cover your mouth and nose with a tissue when you cough or sneeze, or you can cough or sneeze into your sleeve. Throw used tissues in a lined trash can, and immediately wash your hands with soap and water for at least 20 seconds or use an alcohol-based hand rub.  Wash your [facility] your hands often and thoroughly with soap and water for at least 20 seconds. You can use an alcohol-based hand sanitizer if soap and water are not available and if your hands are not visibly dirty. Avoid touching your eyes, nose, and mouth with unwashed hands.   Prevention Steps for Caregivers and Household Members of Individuals Confirmed to have, or Being Evaluated for, COVID-19 Infection Being Cared for in the Home  If you live with, or provide care at home for, a person confirmed to have, or being evaluated for, COVID-19 infection please follow these guidelines to prevent infection:  Follow healthcare provider's instructions Make sure that you understand and can help the patient follow any healthcare provider instructions for all care.  Provide for the patient's basic needs You should help the patient with basic needs in the home and provide support for getting groceries, prescriptions, and other personal needs.  Monitor the patient's symptoms If they are getting sicker, call his or her medical provider and tell them that the patient has, or is being evaluated for, COVID-19 infection. This will help the healthcare provider's  office take steps to keep other people from getting infected. Ask the healthcare provider to call the local or state health department.  Limit the number of people who have contact with the patient  If possible, have only one caregiver for the  patient.  Other household members should stay in another home or place of residence. If this is not possible, they should stay  in another room, or be separated from the patient as much as possible. Use a separate bathroom, if available.  Restrict visitors who do not have an essential need to be in the home.  Keep older adults, very young children, and other sick people away from the patient Keep older adults, very young children, and those who have compromised immune systems or chronic health conditions away from the patient. This includes people with chronic heart, lung, or kidney conditions, diabetes, and cancer.  Ensure good ventilation Make sure that shared spaces in the home have good air flow, such as from an air conditioner or an opened window, weather permitting.  Wash your hands often  Wash your hands often and thoroughly with soap and water for at least 20 seconds. You can use an alcohol based hand sanitizer if soap and water are not available and if your hands are not visibly dirty.  Avoid touching your eyes, nose, and mouth with unwashed hands.  Use disposable paper towels to dry your hands. If not available, use dedicated cloth towels and replace them when they become wet.  Wear a facemask and gloves  Wear a disposable facemask at all times in the room and gloves when you touch or have contact with the patient's blood, body fluids, and/or secretions or excretions, such as sweat, saliva, sputum, nasal mucus, vomit, urine, or feces.  Ensure the mask fits over your nose and mouth tightly, and do not touch it during use.  Throw out disposable facemasks and gloves after using them. Do not reuse.  Wash your hands immediately after removing your facemask and gloves.  If your personal clothing becomes contaminated, carefully remove clothing and launder. Wash your hands after handling contaminated clothing.  Place all used disposable facemasks, gloves, and other waste in a lined  container before disposing them with other household waste.  Remove gloves and wash your hands immediately after handling these items.  Do not share dishes, glasses, or other household items with the patient  Avoid sharing household items. You should not share dishes, drinking glasses, cups, eating utensils, towels, bedding, or other items with a patient who is confirmed to have, or being evaluated for, COVID-19 infection.  After the person uses these items, you should wash them thoroughly with soap and water.  Wash laundry thoroughly  Immediately remove and wash clothes or bedding that have blood, body fluids, and/or secretions or excretions, such as sweat, saliva, sputum, nasal mucus, vomit, urine, or feces, on them.  Wear gloves when handling laundry from the patient.  Read and follow directions on labels of laundry or clothing items and detergent. In general, wash and dry with the warmest temperatures recommended on the label.  Clean all areas the individual has used often  Clean all touchable surfaces, such as counters, tabletops, doorknobs, bathroom fixtures, toilets, phones, keyboards, tablets, and bedside tables, every day. Also, clean any surfaces that may have blood, body fluids, and/or secretions or excretions on them.  Wear gloves when cleaning surfaces the patient has come in contact with.  Use a diluted bleach solution (e.g., dilute bleach with 1  part bleach and 10 parts water) or a household disinfectant with a label that says EPA-registered for coronaviruses. To make a bleach solution at home, add 1 tablespoon of bleach to 1 quart (4 cups) of water. For a larger supply, add  cup of bleach to 1 gallon (16 cups) of water.  Read labels of cleaning products and follow recommendations provided on product labels. Labels contain instructions for safe and effective use of the cleaning product including precautions you should take when applying the product, such as wearing gloves or  eye protection and making sure you have good ventilation during use of the product.  Remove gloves and wash hands immediately after cleaning.  Monitor yourself for signs and symptoms of illness Caregivers and household members are considered close contacts, should monitor their health, and will be asked to limit movement outside of the home to the extent possible. Follow the monitoring steps for close contacts listed on the symptom monitoring form.   ? If you have additional questions, contact your local health department or call the epidemiologist on call at [phone] (available 24/7). ? This guidance is subject to change. For the most up-to-date guidance from [facility], please refer to their website: YouBlogs.pl

## 2020-04-25 NOTE — Progress Notes (Signed)
Ashley Black is D/C'd home per MD order. Discussed with the patient and all questions fully answered.   VVS, Skin clean, dry and intact without evidence of skin break down, no evidence of skin tears noted.  IV catheter discontinued intact. Site without signs and symptoms of complications. Dressing and pressure applied.  An After Visit Summary was printed and given to the patient.  Patient escorted via [place], and D/C home via private auto.  [name]  04/25/2020 4:05 PM

## 2020-04-28 ENCOUNTER — Ambulatory Visit: Payer: Medicare Other | Admitting: Neurology

## 2020-05-06 ENCOUNTER — Ambulatory Visit: Payer: Medicare Other | Admitting: Neurology

## 2020-05-28 ENCOUNTER — Ambulatory Visit: Payer: Medicare Other | Admitting: Neurology

## 2020-05-28 ENCOUNTER — Other Ambulatory Visit: Payer: Self-pay

## 2020-05-28 ENCOUNTER — Encounter: Payer: Self-pay | Admitting: Neurology

## 2020-05-28 VITALS — BP 122/76 | HR 43 | Ht 65.0 in | Wt 151.4 lb

## 2020-05-28 DIAGNOSIS — R413 Other amnesia: Secondary | ICD-10-CM

## 2020-05-28 DIAGNOSIS — E785 Hyperlipidemia, unspecified: Secondary | ICD-10-CM

## 2020-05-28 MED ORDER — MEMANTINE HCL 5 MG PO TABS: ORAL_TABLET | ORAL | 11 refills | Status: DC

## 2020-05-28 NOTE — Patient Instructions (Signed)
Good to meet you!  1. Bloodwork for TSH, B12, lipid panel  2. Schedule MRI brain without contrast  3. Schedule Neurocognitive testing  4. Start Memantine (Namenda) 5mg : take 1 tablet every night for 2 weeks, then increase to 1 tablet twice a day  5. There are some activities which have therapeutic value and can be useful in keeping you cognitively stimulated. You can try this website: https://www.[facility].org/get-to-know-barrow/centers-programs/neurorehabilitation-center/neuro-rehab-apps-and-games/ which has options, categorized by level of difficulty.  6. Follow-up in 6 months, call for any changes   FALL PRECAUTIONS: Be cautious when walking. Scan the area for obstacles that may increase the risk of trips and falls. When getting up in the mornings, sit up at the edge of the bed for a few minutes before getting out of bed. Consider elevating the bed at the head end to avoid drop of blood pressure when getting up. Walk always in a well-lit room (use night lights in the walls). Avoid area rugs or power cords from appliances in the middle of the walkways. Use a walker or a cane if necessary and consider physical therapy for balance exercise. Get your eyesight checked regularly.  FINANCIAL OVERSIGHT: Supervision, especially oversight when making financial decisions or transactions is also recommended.  HOME SAFETY: Consider the safety of the kitchen when operating appliances like stoves, microwave oven, and blender. Consider having supervision and share cooking responsibilities until no longer able to participate in those. Accidents with firearms and other hazards in the house should be identified and addressed as well.  ABILITY TO BE LEFT ALONE: If patient is unable to contact 911 operator, consider using LifeLine, or when the need is there, arrange for someone to stay with patients. Smoking is a fire hazard, consider supervision or cessation. Risk of wandering should be assessed by caregiver  and if detected at any point, supervision and safe proof recommendations should be instituted.  MEDICATION SUPERVISION: Inability to self-administer medication needs to be constantly addressed. Implement a mechanism to ensure safe administration of the medications.  RECOMMENDATIONS FOR ALL PATIENTS WITH MEMORY PROBLEMS: 1. Continue to exercise (Recommend 30 minutes of walking everyday, or 3 hours every week) 2. Increase social interactions - continue going to [place] and enjoy social gatherings with friends and family 3. Eat healthy, avoid fried foods and eat more fruits and vegetables 4. Maintain adequate blood pressure, blood sugar, and blood cholesterol level. Reducing the risk of stroke and cardiovascular disease also helps promoting better memory. 5. Avoid stressful situations. Live a simple life and avoid aggravations. Organize your time and prepare for the next day in anticipation. 6. Sleep well, avoid any interruptions of sleep and avoid any distractions in the bedroom that may interfere with adequate sleep quality 7. Avoid sugar, avoid sweets as there is a strong link between excessive sugar intake, diabetes, and cognitive impairment We discussed the Mediterranean diet, which has been shown to help patients reduce the risk of progressive memory disorders and reduces cardiovascular risk. This includes eating fish, eat fruits and green leafy vegetables, nuts like almonds and hazelnuts, walnuts, and also use olive oil. Avoid fast foods and fried foods as much as possible. Avoid sweets and sugar as sugar use has been linked to worsening of memory function.  There is always a concern of gradual progression of memory problems. If this is the case, then we may need to adjust level of care according to patient needs. Support, both to the patient and caregiver, should then be put into place.
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
## 2020-06-03 ENCOUNTER — Other Ambulatory Visit: Payer: Self-pay

## 2020-06-03 ENCOUNTER — Other Ambulatory Visit (INDEPENDENT_AMBULATORY_CARE_PROVIDER_SITE_OTHER): Payer: Medicare Other

## 2020-06-03 DIAGNOSIS — R413 Other amnesia: Secondary | ICD-10-CM | POA: Diagnosis not present

## 2020-06-03 DIAGNOSIS — E785 Hyperlipidemia, unspecified: Secondary | ICD-10-CM

## 2020-06-03 LAB — LIPID PANEL
Cholesterol: 222 mg/dL — ABNORMAL HIGH (ref 0–200)
HDL: 43.7 mg/dL (ref >39.00)
LDL Cholesterol: 153 mg/dL — ABNORMAL HIGH (ref 0–99)
NonHDL: 177.84
Total CHOL/HDL Ratio: 5
Triglycerides: 122 mg/dL (ref 0.0–149.0)
VLDL: 24.4 mg/dL (ref 0.0–40.0)

## 2020-06-03 LAB — TSH: TSH: 2.62 u[IU]/mL (ref 0.35–4.50)

## 2020-06-03 LAB — VITAMIN B12: Vitamin B-12: 422 pg/mL (ref 211–911)

## 2020-06-04 ENCOUNTER — Telehealth: Payer: Self-pay

## 2020-06-04 NOTE — Telephone Encounter (Signed)
-----   Message from Cameron Sprang, MD sent at 06/04/2020  9:06 AM EDT ----- Pls let daughter know the bloodwork was okay except for her cholesterol level, the LDL 153, would like it less than 100, can either do low cholesterol diet/exercise or restart statin with PCP. Thanks

## 2020-06-04 NOTE — Telephone Encounter (Signed)
Pt called informed that the bloodwork was okay except for her cholesterol level, the LDL 153, would like it less than 100, can either do low cholesterol diet/exercise or restart statin with PCP. Verbalized understanding made note of her LDL

## 2020-06-19 ENCOUNTER — Other Ambulatory Visit: Payer: Self-pay | Admitting: Neurology

## 2020-06-19 ENCOUNTER — Other Ambulatory Visit: Payer: Medicare Other

## 2020-07-03 ENCOUNTER — Encounter: Payer: Self-pay | Admitting: Counselor

## 2020-07-03 ENCOUNTER — Encounter: Payer: Medicare Other | Admitting: Counselor

## 2020-07-03 ENCOUNTER — Ambulatory Visit: Payer: Medicare Other | Admitting: Counselor

## 2020-07-03 ENCOUNTER — Other Ambulatory Visit: Payer: Self-pay

## 2020-07-03 ENCOUNTER — Ambulatory Visit: Payer: Medicare Other

## 2020-07-03 DIAGNOSIS — R413 Other amnesia: Secondary | ICD-10-CM

## 2020-07-03 DIAGNOSIS — F09 Unspecified mental disorder due to known physiological condition: Secondary | ICD-10-CM | POA: Diagnosis not present

## 2020-07-03 DIAGNOSIS — F4321 Adjustment disorder with depressed mood: Secondary | ICD-10-CM | POA: Diagnosis not present

## 2020-07-03 DIAGNOSIS — Z8616 Personal history of COVID-19: Secondary | ICD-10-CM
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
## 2020-07-03 NOTE — Progress Notes (Signed)
   Psychometrist Note   Cognitive testing was administered to Ashley Black by Lamar Benes, B.S. (Technician) under the supervision of Alphonzo Severance, Psy.D., ABN. [name] was able to tolerate all test procedures. Dr. [name] met with the patient as needed to manage any emotional reactions to the testing procedures. Rest breaks were offered.    The battery of tests administered was selected by Dr. [name] with consideration to the patient's current level of functioning, the nature of her symptoms, emotional and behavioral responses during the interview, level of literacy, observed level of motivation/effort, and the nature of the referral question. This battery was communicated to the psychometrist. Communication between Dr. [name] and the psychometrist was ongoing throughout the evaluation and Dr. [name] was immediately accessible at all times. Dr. [name] provided supervision to the technician on the date of this service, to the extent necessary to assure the quality of all services provided.    [name] will return in approximately one week for an interactive feedback session with Dr. [name], at which time test performance, clinical impressions, and treatment recommendations will be reviewed in detail. The patient understands she can contact our office should she require our assistance before this time.   A total of 95 minutes of billable time were spent with Ashley Black by the technician, including test administration and scoring time. Billing for these services is reflected in Dr. Les Pou note.   This note reflects time spent with the psychometrician and does not include test scores, clinical history, or any interpretations made by Dr. [name]. The full report will follow in a separate note.

## 2020-07-06 NOTE — Progress Notes (Signed)
[facility]  Patient Name: Ashley Black MRN: [id number] Date of Birth: 1940-09-30 Age: 79 y.o. Education: 12 years  Measurement properties of test scores: IQ, Index, and Standard Scores (SS): Mean = 100; Standard Deviation = 15 Scaled Scores (Ss): Mean = 10; Standard Deviation = 3 Z scores (Z): Mean = 0; Standard Deviation = 1 T scores (T); Mean = 50; Standard Deviation = 10  TEST SCORES:    Note: This summary of test scores accompanies the interpretive report and should not be interpreted by unqualified individuals or in isolation without reference to the report. Test scores are relative to age, gender, and educational history as available and appropriate.   Performance Validity        "A" Random Letter Test Raw  Descriptor      Errors 0 Within Expectation  The Dot Counting Test: 27 Below Expectation      Mental Status Screening     Total Score Descriptor  MoCA 19 Mild Dementia      Expected Functioning        Wide Range Achievement Test: Standard/Scaled Score Percentile      Word Reading 106 66      Attention/Processing Speed        Neuropsychological Assessment Battery (Attention Module, Form 1): T-score Percentile      Digits Forward 47 38      Digits Backwards 47 38      Repeatable Battery for the Assessment of Neuropsychological Status (Form A): Scaled Score Percentile      Coding 3 1      Language        Neuropsychological Assessment Battery (Language Module, Form 1): T-score Percentile      Naming   (22) 32 4      Verbal Fluency: T-score Percentile      Controlled Oral Word Association (F-A-S) 41 18      Semantic Fluency (Animals) 35 7      Memory:        Neuropsychological Assessment Battery (Memory Module, Form 1): T-score Percentile      List Learning           List A Immediate Recall   (4, 4, 6) 39 14         List B Immediate Recall   (3) 48 42         List A Short Delayed Recall   (0) 26 1         List  A Long Delayed Recall   (0) 32 4         List A Percent Retention   (0 %) --- <1         List A Long Delayed Yes/No Recognition Hits   (10) --- 31         List A Long Delayed Yes/No Recognition False Alarms   (14) --- 2         List A Recognition Discriminability Index --- <1     Story Learning           Immediate Recall   (8, 19) 29 2         Delayed Recall   (18) 35 7         Percent Retention   (95 %) --- 69      Daily Living Memory            Immediate Recall   (15, 12) 38 12  Delayed Recall   (4, 0) 27 1          Percent Retention (31 %) --- 1          Recognition Hits    (6) --- 8      Repeatable Battery for the Assessment of Neuropsychological Status (Form A): Scaled Score Percentile         Figure Recall   (0) 1 <1      Visuospatial/Constructional Functioning        Repeatable Battery for the Assessment of Neuropsychological Status (Form A): Standard/Scaled Score Percentile     Visuospatial/Constructional Index 60 <1         Figure Copy   (8) 1 <1         Judgment of Line Orientation   (11) --- 3-9      Executive Functioning        [employer] Test (MWCST): Standard/T-Score Percentile      Number of Categories Correct 21 <1      Number of Perseverative Errors 34 5      Number of Total Errors 30 2      Percent Perseverative Errors 43 25  Executive Function Composite 63 1      Trail Making Test: T-Score Percentile      Part A 23 <1      Part B DC DC      [place] Diagnostic Aphasia Exam: Raw Score Scaled Score      Complex Ideational Material 7 2      Clock Drawing Raw Score Descriptor      Command 3 Severe Impairment      Rating Scales        Clinical Dementia Rating Raw Score Descriptor      Sum of Boxes 4.0 Very Mild Dementia      Global Score 1.0 Mild Dementia      Quick Dementia Rating System Raw Score Descriptor      Sum of Boxes 3 Very Mild Dementia      Total Score 4 MCI  Geriatric Depression Scale - Short Form 1 Negative    Lashundra Shiveley V. [name] PsyD, [place] Clinical Neuropsychologist

## 2020-07-07 NOTE — Progress Notes (Signed)
[facility]  Patient Name: Ashley Black MRN: [id number] Date of Birth: 12-22-1940 Age: 79 y.o. Education: 66 years  Clinical Impressions  Ashley Black is an 79 y.o., left-hand dominant, married woman with a history of thinking and memory problems over the past 2 - 3 years noticed more by her family than by her. She is having rapid forgetting of information at this point in time. Her difficulties started after she began taking a statin, but then continued after she stopped the medication. There was some subsequent deterioration after having a breakthrough COVID infection. Her daughter thinks she is back to where she was before having COVID although she remains with fairly significant difficulties as compared to her presumed baseline level of function.   There is evidence from neuropsychological testing of objective changes in memory, language (I.e., unusually low naming, diminished semantic as compared to phonemic fluency) , and executive function. She did have preserved retention of information on short story learning although my sense is that this likely represents residual capacity in the setting of a storage problem, given that all her other scores show a storage profile. In addition to these findings, there is notable visuospatial dysfunction, showing on dot counting, tasks involving visual scanning, figure copy, and judgment of angular line orientations. Qualitatively, her figure copy was not grossly distorted and the gestalt was preserved but there were many inaccuracies including misplaced figure elements and omitted details. She was characterized as functioning at a mild cognitive impairment to mild dementia level by her daughter, but her CDR places her in the very mild to mild dementia range.   Ashley Black is thus demonstrating fairly significant cognitive difficulties, amounting to a dementia level problem. She has findings typical of Alzheimer's  clinical syndrome albeit with modestly preserved memory on select measures and more pronounced visuospatial and executive difficulties than typical given her very mild to mild level of progression. Barring any surprises on her MRI, my sense is that Alzheimer's is still the likely cause of her problems given demographics, overall test profile, and lack of other sufficiently specific features suggesting another cause. The condition was likely developing and then worsened following her COVID infection, as can often happen in individuals with dementia or MCI during acute periods of injury/illness. Would recommend starting a cholinesterase inhibitor. She may also benefit from implementing a MIND DASH diet, increasing exercise, and cognitively stimulating activities. She has already stopped driving and may benefit from at least monitoring of her medication compliance. Will also discuss elder law consultation with her if she does not already have that in place.    Diagnostic Impressions: Major neurocognitive disorder due to possible Alzheimer's disease  Test Findings  Test scores are summarized in additional documentation associated with this encounter. Test scores are relative to age, gender, and educational history as available and appropriate. There were no concerns about performance validity despite an anomalous finding on dot counting, which likely reflects her significant visuospatial dysfunction as opposed to any validity problem per se.   General Intellectual Functioning/Achievement:  Performance on single word reading was toward the upper aspect of the average range, which presents as a reasonable standard of comparison for her cognitive test scores.   Attention and Processing Efficiency: Indicators of attention generated good average range scores on both digit repetition forward and digit repetition backward.   Processing speed was extremely low on timed number-symbol coding, which may to some  extent reflect visual scanning and visuospatial processing problems in  the context of her other test data. She performed in the extremely low range on simple numeric sequencing.  Language: Performance on language measures showed unusually low visual object confrontation naming. Semantic fluency was unusually low whereas phonemic fluency was low average, a pattern often observed in Alzheimer's type clinical presentations.   Visuospatial Function: Performance on visuospatial and constructional measures was markedly impaired, with an extremely low score on the overall index. Copy of a line drawing was extremely low with details misplaced, omitted details, and inaccuracies in reproduction, albeit with preserved overall gestalt. Judgment of angular line orientations was unusually low. Qualitatively, difficulties were noted with visual scanning on several measures and on the dot counting test.   Learning and Memory: Performance on learning and memory measures was impaired with weak encoding of information and significant forgetting across time in most cases. She did retain a fair amount of information on short story learning, but my sense is that this represents residual capacity in the setting of a memory storage problem as opposed to an executively mediated memory problem given a storage profile on all other measures.   In the verbal realm, she learned 4, 4, and 6 words of a 12-item word list across three learning trials, which is low average. She did not retain any words on long-delayed recall and had more false positives than correct identifications on delayed recognition testing. Memory for a short story was better and although her initial encoding was extremely low, she recalled nearly all of that information on delayed recall, generating an unusually low score and an average score with respect to percent retention. Memory for brief daily living type information was low average on immediate recall and  extremely low on delayed recall.   In the visual realm, delayed recall of a modestly complex figure stimulus was extremely low (did not remember any figure details).   Executive Functions: Performance on [employer] was notable impaired, with an extremely low score on the Executive Function Composite from a rule-based categorization procedure involving card sorting. Alternating sequencing of numbers and letters of the alphabet was discontinued due to difficulties with the instructions, suggesting significant difficulties. Clock drawing suggested "severe impairment" with numbers outside the clock face, missing numbers, and no hands. Reasoning with complex verbal information was extremely low on the Complex Ideational Material. She did a bit better, in the low average range, when generating words in response to the letters F-A-S.   Rating Scale(s): Ms. Ditullio screened negative for the presence of depression. Her daughter characterized her as functioning at an MCI to very mild dementia level of function. Her CDR Sum of Boxes is 4.0, her global score is 1.0, which is very mild dementia at best and I think there may be some underappreciation of difficulties given her lack of involvement in cognitively demanding tasks.   Viviano Simas [name] PsyD, [place] Clinical Neuropsychologist

## 2020-07-08 ENCOUNTER — Other Ambulatory Visit: Payer: Self-pay

## 2020-07-08 ENCOUNTER — Ambulatory Visit
Admission: RE | Admit: 2020-07-08 | Discharge: 2020-07-08 | Disposition: A | Discharge status: Home / Self Care | Payer: Medicare Other | Source: Ambulatory Visit | Attending: Neurology | Admitting: Neurology

## 2020-07-08 DIAGNOSIS — E785 Hyperlipidemia, unspecified: Secondary | ICD-10-CM

## 2020-07-08 DIAGNOSIS — R413 Other amnesia: Secondary | ICD-10-CM

## 2020-07-17 ENCOUNTER — Encounter: Payer: Medicare Other | Admitting: Counselor

## 2020-07-17 ENCOUNTER — Encounter: Payer: Self-pay | Admitting: Counselor

## 2020-07-17 ENCOUNTER — Ambulatory Visit (INDEPENDENT_AMBULATORY_CARE_PROVIDER_SITE_OTHER): Payer: Medicare Other | Admitting: Counselor

## 2020-07-17 ENCOUNTER — Other Ambulatory Visit: Payer: Self-pay

## 2020-07-17 DIAGNOSIS — G301 Alzheimer's disease with late onset: Secondary | ICD-10-CM

## 2020-07-17 DIAGNOSIS — F0280 Dementia in other diseases classified elsewhere without behavioral disturbance: Secondary | ICD-10-CM

## 2020-07-17 NOTE — Progress Notes (Signed)
NEUROPSYCHOLOGY TELEMEDICINE NOTE [facility]  Telemedicine statement:  I discussed the limitations of neuropsychological care via telemedicine and the availability of in person appointments. The patient expressed understanding and agreed to proceed. The patient was verified with two identifiers.  The visit modality was: telephonic The patient location was: home The provider location was: office  The following individuals participated: Ashley Black  Feedback Note: I met with Ashley Black to review the findings resulting from her neuropsychological evaluation. Since the last appointment, she has been about the same. She has gotten an MRI, which I do not believe has been resulted yet. To my eye, the study shows fairly significant moderate volume loss, more in the bilateral frontal and parietal lobes, with relative hippocampal sparing (although there is still some hippocampal volume loss). There is a mild + burden of confluent leukoaraiosis, particularly in the basal ganglia with extension into the temporal lobe on the right. This may represent sequelae of microvascular ischemic disease vs. Lacunar type infarct. The imaging would be consistent with conditions involving primarily frontal and parietal lobe volume loss, including so-called "hippocampal sparing" Alzheimer's disease. Time was spent reviewing the impressions and recommendations that are detailed in the evaluation report. We discussed impression of very mild dementia, likely due to AD, albeit with some atypical features. Reviewed MIND diet, she is already on Namenda, no Aricept given bradycardia. I encouraged Ashley Black to contact me should she have any further questions or if further follow up is desired.   Current Medications and Medical History   Current Outpatient Medications  Medication Sig Dispense Refill  . ALPRAZolam (XANAX) 0.25 MG tablet Take 0.125-0.25 mg by mouth 2 (two) times daily as needed.  (Patient not taking: Reported on 05/28/2020)    . feeding supplement, ENSURE ENLIVE, (ENSURE ENLIVE) LIQD Take 237 mLs by mouth 3 (three) times daily between meals. (Patient not taking: Reported on 05/28/2020) 237 mL 12  . Loratadine (CLARITIN PO) Take by mouth daily.    . memantine (NAMENDA) 5 MG tablet TAKE 1 TABLET BY MOUTH EVERY NIGHT FOR 2 WEEKS, THEN INCREASE TO 1 TABLET TWICE A DAY 180 tablet 0  . Multiple Vitamins-Minerals (ICAPS AREDS 2 PO) Take 1 tablet by mouth in the morning and at bedtime. (Patient not taking: Reported on 05/28/2020)    . potassium chloride (K-DUR) 10 MEQ tablet Take one tablet by mouth daily. (Patient not taking: Reported on 05/28/2020) 90 tablet 2   No current facility-administered medications for this visit.    Patient Active Problem List   Diagnosis Date Noted  . Gastroenteritis due to COVID-19 virus 04/21/2020  . COVID-19 virus infection 04/20/2020  . Hypokalemia 04/20/2020  . Acute lower UTI 04/20/2020  . Dehydration 04/20/2020  . Dyslipidemia   . Edema of both legs   . Dizziness   . Bradycardia     Mental Status and Behavioral Observations  Ashley Black was available at the prespecified time for this telephonic appointment and was alert and generally oriented (orientation not formally assessed). Speech was normal in rate, rhythm, volume, and prosody. Self-reported mood was "good" and affect as assessed by vocal quality was mainly euthymic. Thought process was difficult to assess, as her verbal participation was limited. Thought content was appropraite. There were no safety concerns identified at today's encounter, such as thoughts of harming self or others.   Plan  Feedback provided regarding the patient's neuropsychological evaluation. She likely has very mild AD, albeit with some atypical features, and  perhaps a more minor vascular contribution. She is on Namenda and will implement MIND DASH diet and try to get more exercise. Ashley Black was  encouraged to contact me if any questions arise or if further follow up is desired.   Ashley Black Ashley Kindred, PsyD, AB[place] Clinical Neuropsychologist  Service(s) Provided at This Encounter: 31 minutes [phone]; Conjoint therapy with patient present)

## 2020-07-17 NOTE — Patient Instructions (Signed)
We discussed your presentation and performance, which showed memory problems that I suspect represent a developing storage issue. You had relatively more in the way of visuospatial and executive difficulties than I typically encounter at this stage of illness. I believe the best diagnosis is very mild dementia.   Dementia refers to a group of syndromes where multiple areas of ability are damaged in the brain, such as memory, thinking, judgment, and behavior, and most commonly refers to age related causes of dementia that cause worsening in these abilities over time. Alzheimer's disease is the most common form of dementia in people over the age of 47. Not all dementias are Alzheimer's disease, but all Alzheimer's disease is dementia. When dementia is due to an underlying condition affecting the brain, such as Alzheimer's disease, there is progression over time, which typically proceeds gradually over many years.   We discussed that in your case, you likely have Alzheimer's, albeit with some less common features. Your imaging is also consistent with this interpretation as it shows more atrophy in the parietal and frontal lobes and a bit less atrophy in the temporal lobes than I sometimes expect to see in typical presentations. In the clinical research literature, a so-called "hippocampal sparing" subtype of Alzheimer's has been described, which matches the pattern of volume loss on your MRI. Sometimes, these types of presentations can have more in the way of visuospatial and executive issues.   We discussed the importance of diet and lifestyle changes for managing progression.   There is now good quality evidence from at least one large scale study that a modified mediterranean diet may help slow cognitive decline. This is known as the "MIND" diet. The Mind diet is not so much a specific diet as it is a set of recommendations for things that you should and should not eat.   Foods that are ENCOURAGED on the  MIND Diet:  Green, leafy vegetables: Aim for six or more servings per week. This includes kale, spinach, cooked greens and salads.  All other vegetables: Try to eat another vegetable in addition to the green leafy vegetables at least once a day. It is best to choose non-starchy vegetables because they have a lot of nutrients with a low number of calories.  Berries: Eat berries at least twice a week. There is a plethora of research on strawberries, and other berries such as blueberries, raspberries and blackberries have also been found to have antioxidant and brain health benefits.  Nuts: Try to get five servings of nuts or more each week. The creators of the [place] don't specify what kind of nuts to consume, but it is probably best to vary the type of nuts you eat to obtain a variety of nutrients. Peanuts are a legume and do not fall into this category.  Olive oil: Use olive oil as your main cooking oil. There may be other heart-healthy alternatives such as algae oil, though there is not yet sufficient research upon which to base a formal recommendation.  Whole grains: Aim for at least three servings daily. Choose minimally processed grains like oatmeal, quinoa, brown rice, whole-wheat pasta and 100% whole-wheat bread.  Fish: Eat fish at least once a week. It is best to choose fatty fish like salmon, sardines, trout, tuna and mackerel for their high amounts of omega-3 fatty acids.  Beans: Include beans in at least four meals every week. This includes all beans, lentils and soybeans.  Poultry: Try to eat chicken or [place] at least  twice a week. Note that fried chicken is not encouraged on the MIND diet.  Wine: Aim for no more than one glass of alcohol daily. Both red and white wine may benefit the brain. However, much research has focused on the red wine compound resveratrol, which may help protect against Alzheimer's disease.  Foods that are DISCOURAGED on the MIND Diet: Butter and margarine: Try to  eat less than 1 tablespoon (about 14 grams) daily. Instead, try using olive oil as your primary cooking fat, and dipping your bread in olive oil with herbs.  Cheese: The MIND diet recommends limiting your cheese consumption to less than once per week.  Red meat: Aim for no more than three servings each week. This includes all beef, pork, lamb and products made from these meats.  [place] food: The MIND diet highly discourages fried food, especially the kind from fast-food restaurants. Limit your consumption to less than once per week.  Pastries and sweets: This includes most of the processed junk food and desserts you can think of. Ice cream, cookies, brownies, snack cakes, donuts, candy and more. Try to limit these to no more than four times a week.  Exercise is one of the best medicines for promoting health and maintaining cognitive fitness at all stages in life. Exercise probably has the largest documented effect on brain health and performance of any lifestyle intervention. Studies have shown that even previously sedentary individuals who start exercising as late as age 71 show a significant survival benefit as compared to their non-exercising peers. In the [place], the current guidelines are for 30 minutes of moderate exercise per day, but increasing your activity level less than that may also be helpful. You do not have to get your 30 minutes of exercise in one shot and exercising for short periods of time spread throughout the day can be helpful. Go for several walks, learn to dance, or do something else you enjoy that gets your body moving. Of course, if you have an underlying medical condition or there is any question about whether it is safe for you to exercise, you should consult a medical treatment provider prior to beginning exercise.   As with all capable adults, I would recommend that you consult with an elder care attorney regarding advanced directives if you have not, such as a healthcare  power of attorney and living will. Consultation with an elder care attorney can help you protect your estate and communicate your preferences to your loved ones formally, in the event you are not able to do so yourself. I can provide my strong recommendation for the [employer], who are located at [address] in [place], [place]. They can be reached at [phone]. They also have a website [email or web address].

## 2020-09-18 ENCOUNTER — Other Ambulatory Visit: Payer: Self-pay | Admitting: Neurology

## 2020-10-27 DIAGNOSIS — Z803 Family history of malignant neoplasm of breast: Secondary | ICD-10-CM | POA: Diagnosis not present

## 2020-10-27 DIAGNOSIS — Z1231 Encounter for screening mammogram for malignant neoplasm of breast: Secondary | ICD-10-CM | POA: Diagnosis not present

## 2020-12-09 ENCOUNTER — Other Ambulatory Visit: Payer: Self-pay | Admitting: Neurology

## 2020-12-14 ENCOUNTER — Ambulatory Visit: Payer: Medicare Other | Admitting: Neurology

## 2021-01-01 ENCOUNTER — Ambulatory Visit: Payer: Medicare Other | Admitting: Neurology
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
## 2021-03-04 ENCOUNTER — Ambulatory Visit: Payer: Medicare Other | Admitting: Neurology

## 2021-03-04 ENCOUNTER — Encounter: Payer: Self-pay | Admitting: Neurology

## 2021-03-04 ENCOUNTER — Other Ambulatory Visit: Payer: Self-pay

## 2021-03-04 VITALS — BP 128/73 | HR 53 | Ht 65.0 in | Wt 145.6 lb

## 2021-03-04 DIAGNOSIS — F0280 Dementia in other diseases classified elsewhere without behavioral disturbance: Secondary | ICD-10-CM

## 2021-03-04 DIAGNOSIS — G301 Alzheimer's disease with late onset: Secondary | ICD-10-CM | POA: Diagnosis not present

## 2021-03-04 MED ORDER — MEMANTINE HCL 10 MG PO TABS: 10.0000 mg | ORAL_TABLET | Freq: Two times a day (BID) | ORAL | 3 refills | Status: DC

## 2021-03-04 NOTE — Patient Instructions (Addendum)
Good to see you!  Increase Memantine to 10mg  twice a day. With your current bottle of Memantine 5mg : Take 2 tablets twice a day. Once finished, your new bottle will be for Memantine 10mg : take 1 tablet twice a day  2. Discuss starting a different medication for mood with Dr. Dema Severin  3. Follow-up with eye doctor as scheduled  4. There are some activities which have therapeutic value and can be useful in keeping you cognitively stimulated. You can try this website: https://www.barrow[facility].org/get-to-know-barrow/centers-programs/neurorehabilitation-center/neuro-rehab-apps-and-games/ which has options, categorized by level of difficulty.  5. Follow-up in 6 months, call for any changes   FALL PRECAUTIONS: Be cautious when walking. Scan the area for obstacles that may increase the risk of trips and falls. When getting up in the mornings, sit up at the edge of the bed for a few minutes before getting out of bed. Consider elevating the bed at the head end to avoid drop of blood pressure when getting up. Walk always in a well-lit room (use night lights in the walls). Avoid area rugs or power cords from appliances in the middle of the walkways. Use a walker or a cane if necessary and consider physical therapy for balance exercise. Get your eyesight checked regularly.  FINANCIAL OVERSIGHT: Supervision, especially oversight when making financial decisions or transactions is also recommended.  HOME SAFETY: Consider the safety of the kitchen when operating appliances like stoves, microwave oven, and blender. Consider having supervision and share cooking responsibilities until no longer able to participate in those. Accidents with firearms and other hazards in the house should be identified and addressed as well.  ABILITY TO BE LEFT ALONE: If patient is unable to contact 911 operator, consider using LifeLine, or when the need is there, arrange for someone to stay with patients. Smoking is a fire hazard, consider  supervision or cessation. Risk of wandering should be assessed by caregiver and if detected at any point, supervision and safe proof recommendations should be instituted.  MEDICATION SUPERVISION: Inability to self-administer medication needs to be constantly addressed. Implement a mechanism to ensure safe administration of the medications.  RECOMMENDATIONS FOR ALL PATIENTS WITH MEMORY PROBLEMS: 1. Continue to exercise (Recommend 30 minutes of walking everyday, or 3 hours every week) 2. Increase social interactions - continue going to [place] and enjoy social gatherings with friends and family 3. Eat healthy, avoid fried foods and eat more fruits and vegetables 4. Maintain adequate blood pressure, blood sugar, and blood cholesterol level. Reducing the risk of stroke and cardiovascular disease also helps promoting better memory. 5. Avoid stressful situations. Live a simple life and avoid aggravations. Organize your time and prepare for the next day in anticipation. 6. Sleep well, avoid any interruptions of sleep and avoid any distractions in the bedroom that may interfere with adequate sleep quality 7. Avoid sugar, avoid sweets as there is a strong link between excessive sugar intake, diabetes, and cognitive impairment We discussed the Mediterranean diet, which has been shown to help patients reduce the risk of progressive memory disorders and reduces cardiovascular risk. This includes eating fish, eat fruits and green leafy vegetables, nuts like almonds and hazelnuts, walnuts, and also use olive oil. Avoid fast foods and fried foods as much as possible. Avoid sweets and sugar as sugar use has been linked to worsening of memory function.  There is always a concern of gradual progression of memory problems. If this is the case, then we may need to adjust level of care according to patient needs. Support, both  to the patient and caregiver, should then be put into place.

## 2021-03-04 NOTE — Progress Notes (Signed)
Patient was seen, evaluated, and treatment plan was discussed with the Advanced Practice Provider. She and her daughter report symptoms have been overall stable, she manages her medications, her husband checks behind her. MMSE today 19/30. Discussed increasing Memantine to 10mg  BID, monitor for side effects. She is having some mood issues, she previously had diarrhea on Lexapro, advised to discuss starting a different SSRI with her PCP. She notes rare episodes of horizontal diplopia ("I am good 85% of the time), usually when she is looking at her pastor and she would see double, she blinks her eyes and the double vision resolves. Continue follow-up with eye doctor. I have also reviewed the orders written for this patient which were under my direction. I agree with the findings and the plan of care as documented by the Advanced Practice Provider.

## 2021-03-05 ENCOUNTER — Encounter: Payer: Self-pay | Admitting: Neurology

## 2021-03-05 ENCOUNTER — Other Ambulatory Visit: Payer: Self-pay | Admitting: Neurology

## 2021-04-08 DIAGNOSIS — L821 Other seborrheic keratosis: Secondary | ICD-10-CM | POA: Diagnosis not present

## 2021-04-08 DIAGNOSIS — Z85828 Personal history of other malignant neoplasm of skin: Secondary | ICD-10-CM | POA: Diagnosis not present

## 2021-04-08 DIAGNOSIS — C44712 Basal cell carcinoma of skin of right lower limb, including hip: Secondary | ICD-10-CM | POA: Diagnosis not present

## 2021-04-08 DIAGNOSIS — D0471 Carcinoma in situ of skin of right lower limb, including hip: Secondary | ICD-10-CM | POA: Diagnosis not present

## 2021-04-08 DIAGNOSIS — L814 Other melanin hyperpigmentation: Secondary | ICD-10-CM | POA: Diagnosis not present

## 2021-04-08 DIAGNOSIS — L570 Actinic keratosis: Secondary | ICD-10-CM | POA: Diagnosis not present

## 2021-04-28 DIAGNOSIS — E785 Hyperlipidemia, unspecified: Secondary | ICD-10-CM | POA: Diagnosis not present

## 2021-04-28 DIAGNOSIS — E538 Deficiency of other specified B group vitamins: Secondary | ICD-10-CM | POA: Diagnosis not present

## 2021-04-28 DIAGNOSIS — E559 Vitamin D deficiency, unspecified: Secondary | ICD-10-CM | POA: Diagnosis not present

## 2021-04-28 DIAGNOSIS — G309 Alzheimer's disease, unspecified: Secondary | ICD-10-CM | POA: Diagnosis not present

## 2021-05-10 DIAGNOSIS — C44712 Basal cell carcinoma of skin of right lower limb, including hip: Secondary | ICD-10-CM | POA: Diagnosis not present

## 2021-07-07 DIAGNOSIS — Z23 Encounter for immunization: Secondary | ICD-10-CM | POA: Diagnosis not present

## 2021-10-04 DIAGNOSIS — Z961 Presence of intraocular lens: Secondary | ICD-10-CM | POA: Diagnosis not present

## 2021-10-04 DIAGNOSIS — H524 Presbyopia: Secondary | ICD-10-CM | POA: Diagnosis not present

## 2021-10-04 DIAGNOSIS — H353131 Nonexudative age-related macular degeneration, bilateral, early dry stage: Secondary | ICD-10-CM | POA: Diagnosis not present

## 2021-11-02 DIAGNOSIS — Z1231 Encounter for screening mammogram for malignant neoplasm of breast: Secondary | ICD-10-CM | POA: Diagnosis not present

## 2021-11-18 DIAGNOSIS — L821 Other seborrheic keratosis: Secondary | ICD-10-CM | POA: Diagnosis not present

## 2021-12-15 DIAGNOSIS — E538 Deficiency of other specified B group vitamins: Secondary | ICD-10-CM | POA: Diagnosis not present

## 2021-12-15 DIAGNOSIS — E785 Hyperlipidemia, unspecified: Secondary | ICD-10-CM | POA: Diagnosis not present

## 2021-12-15 DIAGNOSIS — M8588 Other specified disorders of bone density and structure, other site: Secondary | ICD-10-CM | POA: Diagnosis not present

## 2021-12-15 DIAGNOSIS — G309 Alzheimer's disease, unspecified: Secondary | ICD-10-CM | POA: Diagnosis not present

## 2021-12-15 DIAGNOSIS — R001 Bradycardia, unspecified: Secondary | ICD-10-CM | POA: Diagnosis not present

## 2021-12-15 DIAGNOSIS — E559 Vitamin D deficiency, unspecified: Secondary | ICD-10-CM | POA: Diagnosis not present

## 2021-12-15 DIAGNOSIS — H532 Diplopia: Secondary | ICD-10-CM | POA: Diagnosis not present

## 2021-12-15 DIAGNOSIS — Z0000 Encounter for general adult medical examination without abnormal findings: Secondary | ICD-10-CM | POA: Diagnosis not present

## 2022-01-20 DIAGNOSIS — M85851 Other specified disorders of bone density and structure, right thigh: Secondary | ICD-10-CM | POA: Diagnosis not present

## 2022-01-20 DIAGNOSIS — M85852 Other specified disorders of bone density and structure, left thigh: Secondary | ICD-10-CM | POA: Diagnosis not present

## 2022-01-20 DIAGNOSIS — Z780 Asymptomatic menopausal state: Secondary | ICD-10-CM | POA: Diagnosis not present

## 2022-01-27 IMAGING — DX DG CHEST 1V PORT
1 series · 1 of 1 positions shown · non-contrast
Comparison: 02/20/2015

CLINICAL DATA: Weakness. RTNVF-WH positive. Shortness of breath
with exertion.

EXAM:
PORTABLE CHEST 1 VIEW

[chest]
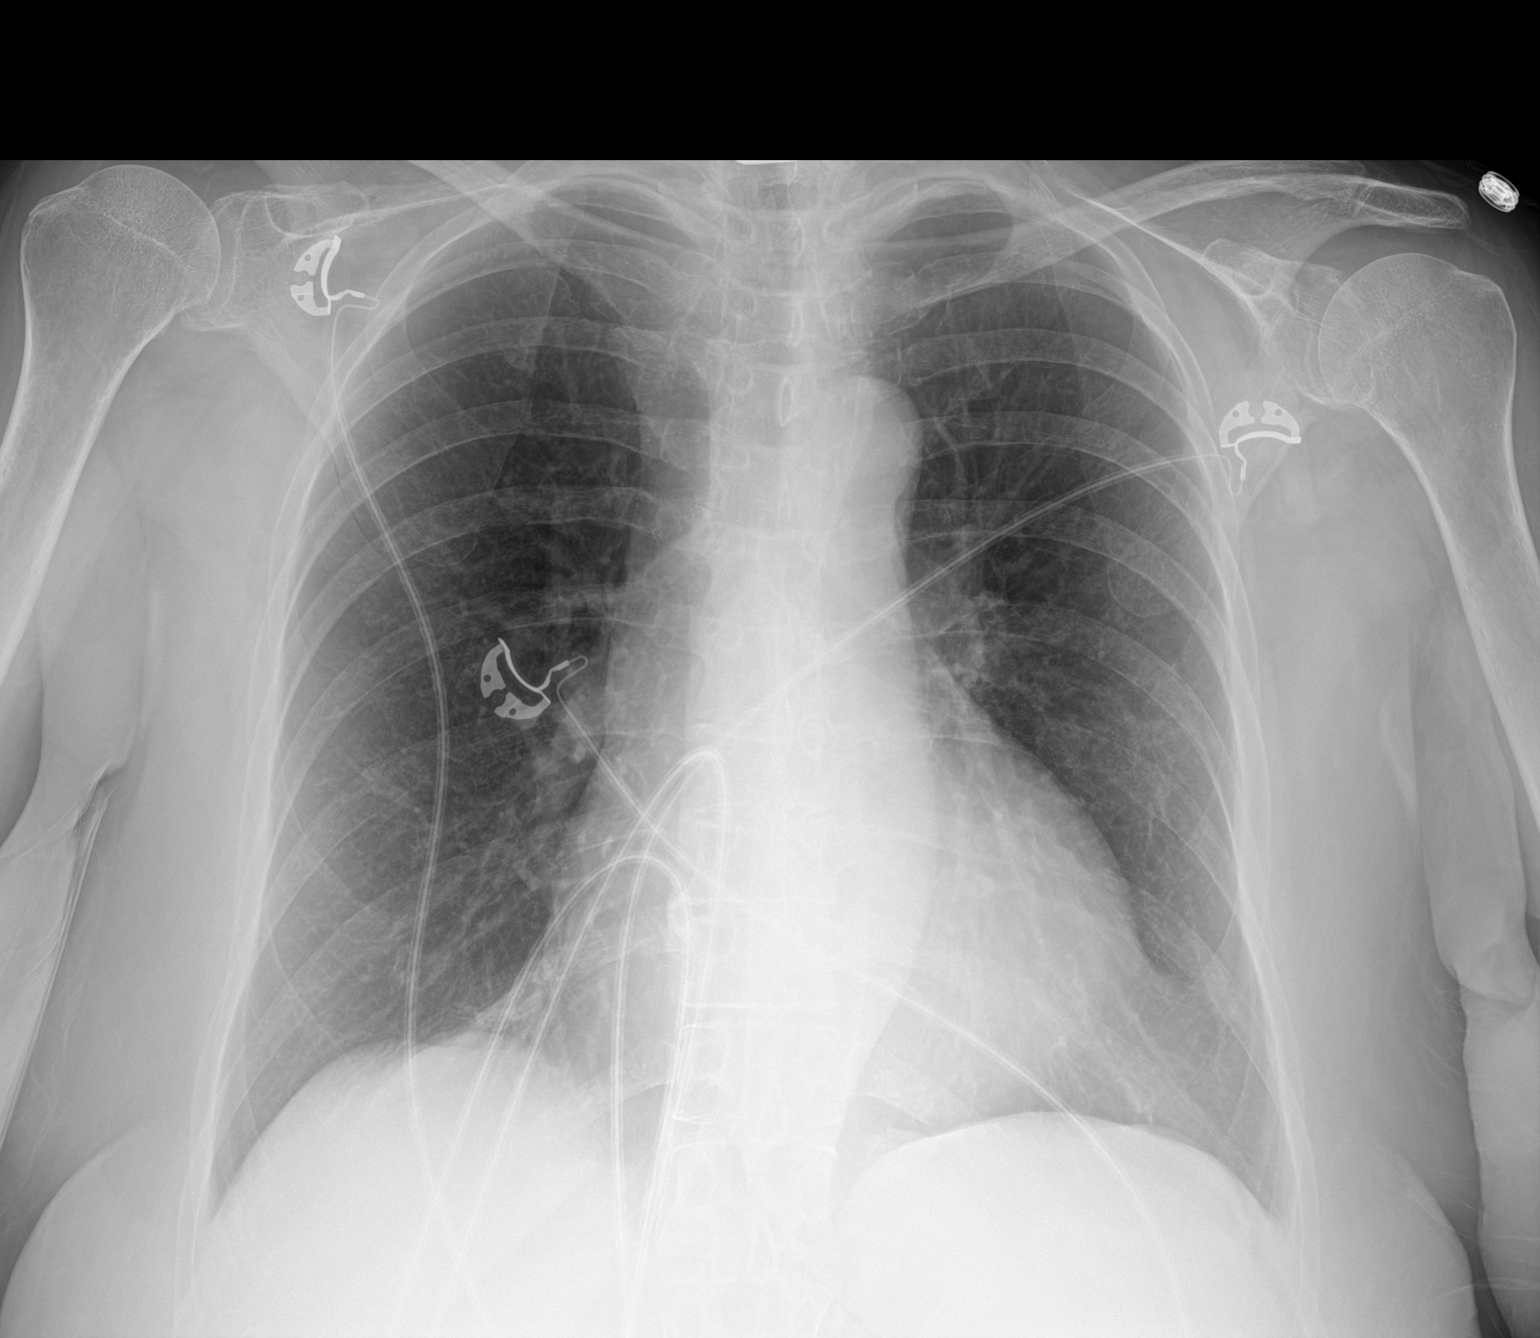

[1 of 1 positions shown; findings below may reference images not displayed]

FINDINGS: Borderline enlarged cardiac silhouette. Tortuous aorta. Clear lungs
with normal vascularity. Cervical spine fixation hardware.
IMPRESSION: No acute abnormality.

## 2022-02-21 DIAGNOSIS — Z79899 Other long term (current) drug therapy: Secondary | ICD-10-CM | POA: Diagnosis not present

## 2022-02-21 DIAGNOSIS — E785 Hyperlipidemia, unspecified: Secondary | ICD-10-CM | POA: Diagnosis not present

## 2022-03-10 ENCOUNTER — Other Ambulatory Visit: Payer: Self-pay | Admitting: Neurology

## 2022-03-21 ENCOUNTER — Other Ambulatory Visit: Payer: Self-pay | Admitting: Neurology

## 2022-03-24 ENCOUNTER — Other Ambulatory Visit: Payer: Self-pay | Admitting: Neurology

## 2022-03-24 ENCOUNTER — Telehealth: Payer: Self-pay | Admitting: Neurology

## 2022-03-24 NOTE — Telephone Encounter (Signed)
Called patient and left a voice mail to call back to schedule a follow up appointment in 6 months with Clarise Cruz and in a year with Dr. Delice Lesch.

## 2022-03-31 ENCOUNTER — Other Ambulatory Visit: Payer: Self-pay

## 2022-03-31 ENCOUNTER — Telehealth: Payer: Self-pay | Admitting: Physician Assistant

## 2022-03-31 MED ORDER — MEMANTINE HCL 10 MG PO TABS: 10.0000 mg | ORAL_TABLET | Freq: Two times a day (BID) | ORAL | 0 refills | Status: DC

## 2022-03-31 NOTE — Telephone Encounter (Signed)
1. Which medications need refilled? (List name and dosage, if known) memantine  2. Which pharmacy/location is medication to be sent to? (include street and city if local pharmacy) [place]  3. Do they need a 30 day or 90 day supply? 90 days

## 2022-04-11 DIAGNOSIS — Z85828 Personal history of other malignant neoplasm of skin: Secondary | ICD-10-CM | POA: Diagnosis not present

## 2022-04-11 DIAGNOSIS — L814 Other melanin hyperpigmentation: Secondary | ICD-10-CM | POA: Diagnosis not present

## 2022-04-11 DIAGNOSIS — D225 Melanocytic nevi of trunk: Secondary | ICD-10-CM | POA: Diagnosis not present

## 2022-04-11 DIAGNOSIS — D235 Other benign neoplasm of skin of trunk: Secondary | ICD-10-CM | POA: Diagnosis not present

## 2022-04-11 DIAGNOSIS — D692 Other nonthrombocytopenic purpura: Secondary | ICD-10-CM | POA: Diagnosis not present

## 2022-04-11 DIAGNOSIS — L578 Other skin changes due to chronic exposure to nonionizing radiation: Secondary | ICD-10-CM | POA: Diagnosis not present

## 2022-04-11 DIAGNOSIS — L570 Actinic keratosis: Secondary | ICD-10-CM | POA: Diagnosis not present

## 2022-04-11 DIAGNOSIS — D2261 Melanocytic nevi of right upper limb, including shoulder: Secondary | ICD-10-CM | POA: Diagnosis not present

## 2022-04-11 DIAGNOSIS — D1801 Hemangioma of skin and subcutaneous tissue: Secondary | ICD-10-CM | POA: Diagnosis not present

## 2022-04-11 DIAGNOSIS — D485 Neoplasm of uncertain behavior of skin: Secondary | ICD-10-CM | POA: Diagnosis not present

## 2022-04-11 DIAGNOSIS — L821 Other seborrheic keratosis: Secondary | ICD-10-CM | POA: Diagnosis not present

## 2022-04-19 DIAGNOSIS — Z79899 Other long term (current) drug therapy: Secondary | ICD-10-CM | POA: Diagnosis not present

## 2022-04-19 DIAGNOSIS — E785 Hyperlipidemia, unspecified: Secondary | ICD-10-CM | POA: Diagnosis not present

## 2022-04-24 ENCOUNTER — Other Ambulatory Visit: Payer: Self-pay | Admitting: Physician Assistant

## 2022-04-26 DIAGNOSIS — L988 Other specified disorders of the skin and subcutaneous tissue: Secondary | ICD-10-CM | POA: Diagnosis not present

## 2022-04-26 DIAGNOSIS — D485 Neoplasm of uncertain behavior of skin: Secondary | ICD-10-CM | POA: Diagnosis not present

## 2022-05-06 ENCOUNTER — Encounter: Payer: Self-pay | Admitting: Physician Assistant

## 2022-05-06 ENCOUNTER — Ambulatory Visit: Payer: Medicare Other | Admitting: Physician Assistant

## 2022-05-06 VITALS — BP 146/86 | HR 81 | Resp 18 | Ht 65.0 in | Wt 153.0 lb

## 2022-05-06 DIAGNOSIS — G301 Alzheimer's disease with late onset: Secondary | ICD-10-CM

## 2022-05-06 DIAGNOSIS — F0280 Dementia in other diseases classified elsewhere without behavioral disturbance: Secondary | ICD-10-CM | POA: Diagnosis not present

## 2022-05-06 MED ORDER — MEMANTINE HCL 10 MG PO TABS: 10.0000 mg | ORAL_TABLET | Freq: Two times a day (BID) | ORAL | 11 refills | Status: DC

## 2022-05-06 NOTE — Progress Notes (Unsigned)
Assessment/Plan:   Dementia likely due to Alzheimer's disease  Ashley Black is a very pleasant 81 y.o. LH female with a history of hyperlipidemia, bradycardia, vitamin D deficiency, depression, seen today in follow up for memory loss. Patient is currently on memantine 10 mg twice daily, tolerating well.  Patient ran out of her medication, and needs refill, for which she is here for evaluation and update prior to represcribing.  MMSE today is 24 /30  with delayed recall 3 /3.    Follow up in 1 year as per her request, she was instructed to be seen earlier if her memory worsens. continue memantine 10 mg twice daily Continue mood control by PCP   Subjective:    This patient is accompanied in the office by her daughter who supplements the history.  Previous records as well as any outside records available were reviewed prior to todays visit.  She is on memantine 10 mg twice daily.   Any changes in memory since last visit?  "Pretty much the same "she enjoys word games, and she continues to remain active, going to church at least 3 times a week for different activities.  Short-term memory is impaired, but her long-term memory is good. Patient lives with: home with husband ,  repeats oneself?  Endorsed Disoriented when walking into a room?  Patient denies   Leaving objects in unusual places?  Patient denies   Ambulates  with difficulty?   Patient denies.  She enjoys doing yard work. Recent falls?  Patient denies   Any head injuries?  Patient denies   History of seizures?   Patient denies   Wandering behavior?  Patient denies   Patient drives?   Patient no longer drives  Any mood changes since last visit?  Patient denies   Any worsening depression?:  Patient denies   Hallucinations?  Patient denies   Paranoia?  Patient denies   Patient reports that sleeps well without vivid dreams, REM behavior or sleepwalking   History of sleep apnea?  Patient denies   Any hygiene concerns?   Patient denies   Independent of bathing and dressing?  Endorsed  Does the patient needs help with medications?  Denies Who is in charge of the finances?  Patient and husband do the finances together.    Any changes in appetite?  Patient denies   Patient have trouble swallowing? Patient denies   Does the patient cook?  Patient denies   Any kitchen accidents such as leaving the stove on? Patient denies   Any headaches?  Patient denies   Double vision?  Her prior visit, the patient was having some double vision, and reports that has been seen by her ophthalmologist, although no notes are available for review.  She denies double vision at this time. Any focal numbness or tingling?  Patient denies   Chronic back pain Patient denies   Unilateral weakness?  Patient denies   Any tremors?  Patient denies   Any history of anosmia?  + Any incontinence of urine?  Patient denies   Any bowel dysfunction?   Patient denies        PREVIOUS MEDICATIONS:   CURRENT MEDICATIONS:  Outpatient Encounter Medications as of 05/06/2022  Medication Sig   Cyanocobalamin (VITAMIN B-12) 2500 MCG SUBL 1 tablet   Loratadine (CLARITIN PO) Take by mouth daily.   memantine (NAMENDA) 10 MG tablet Take 1 tablet (10 mg total) by mouth 2 (two) times daily.   Multiple Vitamins-Minerals (ICAPS AREDS 2  PO) Take 1 tablet by mouth in the morning and at bedtime.   ALPRAZolam (XANAX) 0.25 MG tablet Take 0.125-0.25 mg by mouth 2 (two) times daily as needed. (Patient not taking: Reported on 05/06/2022)   mirtazapine (REMERON) 15 MG tablet Take 15 mg by mouth at bedtime.   No facility-administered encounter medications on file as of 05/06/2022.       03/04/2021    8:00 AM  MMSE - Mini Mental State Exam  Orientation to time 4  Orientation to Place 5  Registration 3  Attention/ Calculation 1  Recall 0  Language- name 2 objects 2  Language- repeat 1  Language- follow 3 step command 1  Language- read & follow direction 1  Write  a sentence 1  Copy design 0  Total score 19      07/03/2020    9:00 AM  Montreal Cognitive Assessment   Visuospatial/ Executive (0/5) 1  Naming (0/3) 3  Attention: Read list of digits (0/2) 2  Attention: Read list of letters (0/1) 1  Attention: Serial 7 subtraction starting at 100 (0/3) 1  Language: Repeat phrase (0/2) 2  Language : Fluency (0/1) 1  Abstraction (0/2) 2  Delayed Recall (0/5) 0  Orientation (0/6) 5  Total 18  Adjusted Score (based on education) 19    Objective:     PHYSICAL EXAMINATION:    VITALS:   Vitals:   05/06/22 1504  BP: (!) 146/86  Pulse: 81  Resp: 18  SpO2: 99%  Weight: 153 lb (69.4 kg)  Height: '5\' 5"'$  (1.651 m)    GEN:  The patient appears stated age and is in NAD. HEENT:  Normocephalic, atraumatic.   Neurological examination:  General: NAD, well-groomed, appears stated age. Orientation: The patient is alert. Oriented to person, place, not to date, it is July 09, 2062 Cranial nerves: There is good facial symmetry.The speech is fluent and clear. No aphasia or dysarthria. Fund of knowledge is appropriate. Recent and remote memory are impaired. Attention and concentration are reduced.  Able to name objects and repeat phrases.  Hearing is intact to conversational tone.    Sensation: Sensation is intact to light touch throughout Motor: Strength is at least antigravity x4. Tremors: none  DTR's 2/4 in UE/LE     Movement examination: Tone: There is normal tone in the UE/LE Abnormal movements:  no tremor.  No myoclonus.  No asterixis.   Coordination:  There is no decremation with RAM's. Normal finger to nose  Gait and Station: The patient has no difficulty arising out of a deep-seated chair without the use of the hands. The patient's stride length is good.  Gait is cautious and narrow.    Thank you for allowing [place] the opportunity to participate in the care of this nice patient. Please do not hesitate to contact us for any questions or  concerns.   Total time spent on today's visit was 20 minutes dedicated to this patient today, preparing to see patient, examining the patient, ordering tests and/or medications and counseling the patient, documenting clinical information in the EHR or other health record, independently interpreting results and communicating results to the patient/family, discussing treatment and goals, answering patient's questions and coordinating care.  Cc:  Harlan Stains, MD  Sharene Butters 05/06/2022 3:14 PM

## 2022-05-06 NOTE — Patient Instructions (Addendum)
Good to see you!   Continue Memantine to '10mg'$  twice a day.   2.  Dr. Dema Severin to manage your mood    4. There are some activities which have therapeutic value and can be useful in keeping you cognitively stimulated. You can try this website: https://www.[facility].org/get-to-know-barrow/centers-programs/neurorehabilitation-center/neuro-rehab-apps-and-games/ which has options, categorized by level of difficulty.  5. Follow-up in 1 year , call for any changes   FALL PRECAUTIONS: Be cautious when walking. Scan the area for obstacles that may increase the risk of trips and falls. When getting up in the mornings, sit up at the edge of the bed for a few minutes before getting out of bed. Consider elevating the bed at the head end to avoid drop of blood pressure when getting up. Walk always in a well-lit room (use night lights in the walls). Avoid area rugs or power cords from appliances in the middle of the walkways. Use a walker or a cane if necessary and consider physical therapy for balance exercise. Get your eyesight checked regularly.  FINANCIAL OVERSIGHT: Supervision, especially oversight when making financial decisions or transactions is also recommended.  HOME SAFETY: Consider the safety of the kitchen when operating appliances like stoves, microwave oven, and blender. Consider having supervision and share cooking responsibilities until no longer able to participate in those. Accidents with firearms and other hazards in the house should be identified and addressed as well.  ABILITY TO BE LEFT ALONE: If patient is unable to contact 911 operator, consider using LifeLine, or when the need is there, arrange for someone to stay with patients. Smoking is a fire hazard, consider supervision or cessation. Risk of wandering should be assessed by caregiver and if detected at any point, supervision and safe proof recommendations should be instituted.  MEDICATION SUPERVISION: Inability to self-administer  medication needs to be constantly addressed. Implement a mechanism to ensure safe administration of the medications.  RECOMMENDATIONS FOR ALL PATIENTS WITH MEMORY PROBLEMS: 1. Continue to exercise (Recommend 30 minutes of walking everyday, or 3 hours every week) 2. Increase social interactions - continue going to [place] and enjoy social gatherings with friends and family 3. Eat healthy, avoid fried foods and eat more fruits and vegetables 4. Maintain adequate blood pressure, blood sugar, and blood cholesterol level. Reducing the risk of stroke and cardiovascular disease also helps promoting better memory. 5. Avoid stressful situations. Live a simple life and avoid aggravations. Organize your time and prepare for the next day in anticipation. 6. Sleep well, avoid any interruptions of sleep and avoid any distractions in the bedroom that may interfere with adequate sleep quality 7. Avoid sugar, avoid sweets as there is a strong link between excessive sugar intake, diabetes, and cognitive impairment We discussed the Mediterranean diet, which has been shown to help patients reduce the risk of progressive memory disorders and reduces cardiovascular risk. This includes eating fish, eat fruits and green leafy vegetables, nuts like almonds and hazelnuts, walnuts, and also use olive oil. Avoid fast foods and fried foods as much as possible. Avoid sweets and sugar as sugar use has been linked to worsening of memory function.  There is always a concern of gradual progression of memory problems. If this is the case, then we may need to adjust level of care according to patient needs. Support, both to the patient and caregiver, should then be put into place.

## 2022-05-28 ENCOUNTER — Other Ambulatory Visit: Payer: Self-pay | Admitting: Physician Assistant

## 2022-06-17 DIAGNOSIS — E785 Hyperlipidemia, unspecified: Secondary | ICD-10-CM | POA: Diagnosis not present

## 2022-06-17 DIAGNOSIS — R410 Disorientation, unspecified: Secondary | ICD-10-CM | POA: Diagnosis not present

## 2022-06-17 DIAGNOSIS — G309 Alzheimer's disease, unspecified: Secondary | ICD-10-CM | POA: Diagnosis not present

## 2022-06-17 DIAGNOSIS — Z23 Encounter for immunization: Secondary | ICD-10-CM | POA: Diagnosis not present

## 2022-06-17 DIAGNOSIS — S30851A Superficial foreign body of abdominal wall, initial encounter: Secondary | ICD-10-CM | POA: Diagnosis not present

## 2022-06-17 DIAGNOSIS — R030 Elevated blood-pressure reading, without diagnosis of hypertension: Secondary | ICD-10-CM | POA: Diagnosis not present

## 2022-06-17 DIAGNOSIS — N3090 Cystitis, unspecified without hematuria: Secondary | ICD-10-CM | POA: Diagnosis not present

## 2022-06-17 DIAGNOSIS — N6459 Other signs and symptoms in breast: Secondary | ICD-10-CM | POA: Diagnosis not present

## 2022-08-06 ENCOUNTER — Emergency Department (HOSPITAL_BASED_OUTPATIENT_CLINIC_OR_DEPARTMENT_OTHER): Payer: Medicare Other

## 2022-08-06 ENCOUNTER — Emergency Department (HOSPITAL_COMMUNITY): Payer: Medicare Other

## 2022-08-06 ENCOUNTER — Other Ambulatory Visit: Payer: Self-pay

## 2022-08-06 ENCOUNTER — Encounter (HOSPITAL_BASED_OUTPATIENT_CLINIC_OR_DEPARTMENT_OTHER): Payer: Self-pay | Admitting: Emergency Medicine

## 2022-08-06 ENCOUNTER — Inpatient Hospital Stay (HOSPITAL_BASED_OUTPATIENT_CLINIC_OR_DEPARTMENT_OTHER)
Admission: EM | Admit: 2022-08-06 | Discharge: 2022-08-09 | DRG: 271 | Disposition: A | Discharge status: Home / Self Care | Payer: Medicare Other | Attending: Internal Medicine | Admitting: Internal Medicine

## 2022-08-06 DIAGNOSIS — G309 Alzheimer's disease, unspecified: Secondary | ICD-10-CM | POA: Diagnosis not present

## 2022-08-06 DIAGNOSIS — Z79899 Other long term (current) drug therapy: Secondary | ICD-10-CM | POA: Diagnosis not present

## 2022-08-06 DIAGNOSIS — I82409 Acute embolism and thrombosis of unspecified deep veins of unspecified lower extremity: Secondary | ICD-10-CM | POA: Diagnosis present

## 2022-08-06 DIAGNOSIS — R109 Unspecified abdominal pain: Secondary | ICD-10-CM | POA: Diagnosis not present

## 2022-08-06 DIAGNOSIS — F32A Depression, unspecified: Secondary | ICD-10-CM | POA: Diagnosis not present

## 2022-08-06 DIAGNOSIS — R29818 Other symptoms and signs involving the nervous system: Secondary | ICD-10-CM | POA: Diagnosis not present

## 2022-08-06 DIAGNOSIS — Z90721 Acquired absence of ovaries, unilateral: Secondary | ICD-10-CM | POA: Diagnosis not present

## 2022-08-06 DIAGNOSIS — R29810 Facial weakness: Secondary | ICD-10-CM | POA: Diagnosis not present

## 2022-08-06 DIAGNOSIS — N179 Acute kidney failure, unspecified: Secondary | ICD-10-CM | POA: Diagnosis present

## 2022-08-06 DIAGNOSIS — I4891 Unspecified atrial fibrillation: Secondary | ICD-10-CM | POA: Diagnosis not present

## 2022-08-06 DIAGNOSIS — I824Y2 Acute embolism and thrombosis of unspecified deep veins of left proximal lower extremity: Secondary | ICD-10-CM | POA: Diagnosis not present

## 2022-08-06 DIAGNOSIS — F02A0 Dementia in other diseases classified elsewhere, mild, without behavioral disturbance, psychotic disturbance, mood disturbance, and anxiety: Secondary | ICD-10-CM | POA: Diagnosis present

## 2022-08-06 DIAGNOSIS — F0390 Unspecified dementia without behavioral disturbance: Secondary | ICD-10-CM | POA: Diagnosis present

## 2022-08-06 DIAGNOSIS — Z90710 Acquired absence of both cervix and uterus: Secondary | ICD-10-CM | POA: Diagnosis not present

## 2022-08-06 DIAGNOSIS — E785 Hyperlipidemia, unspecified: Secondary | ICD-10-CM | POA: Diagnosis not present

## 2022-08-06 DIAGNOSIS — Z87891 Personal history of nicotine dependence: Secondary | ICD-10-CM

## 2022-08-06 DIAGNOSIS — I82412 Acute embolism and thrombosis of left femoral vein: Secondary | ICD-10-CM | POA: Diagnosis not present

## 2022-08-06 DIAGNOSIS — Z8249 Family history of ischemic heart disease and other diseases of the circulatory system: Secondary | ICD-10-CM | POA: Diagnosis not present

## 2022-08-06 DIAGNOSIS — I700 Atherosclerosis of aorta: Secondary | ICD-10-CM | POA: Diagnosis not present

## 2022-08-06 DIAGNOSIS — I7789 Other specified disorders of arteries and arterioles: Secondary | ICD-10-CM | POA: Diagnosis not present

## 2022-08-06 DIAGNOSIS — H539 Unspecified visual disturbance: Secondary | ICD-10-CM | POA: Diagnosis present

## 2022-08-06 DIAGNOSIS — E876 Hypokalemia: Secondary | ICD-10-CM | POA: Diagnosis not present

## 2022-08-06 DIAGNOSIS — R001 Bradycardia, unspecified: Secondary | ICD-10-CM | POA: Diagnosis not present

## 2022-08-06 DIAGNOSIS — I2510 Atherosclerotic heart disease of native coronary artery without angina pectoris: Secondary | ICD-10-CM | POA: Diagnosis not present

## 2022-08-06 DIAGNOSIS — I82402 Acute embolism and thrombosis of unspecified deep veins of left lower extremity: Secondary | ICD-10-CM

## 2022-08-06 DIAGNOSIS — I6621 Occlusion and stenosis of right posterior cerebral artery: Secondary | ICD-10-CM | POA: Diagnosis not present

## 2022-08-06 DIAGNOSIS — M7989 Other specified soft tissue disorders: Secondary | ICD-10-CM | POA: Diagnosis not present

## 2022-08-06 DIAGNOSIS — I82492 Acute embolism and thrombosis of other specified deep vein of left lower extremity: Secondary | ICD-10-CM | POA: Diagnosis not present

## 2022-08-06 DIAGNOSIS — I82422 Acute embolism and thrombosis of left iliac vein: Secondary | ICD-10-CM | POA: Diagnosis not present

## 2022-08-06 DIAGNOSIS — Z86718 Personal history of other venous thrombosis and embolism: Secondary | ICD-10-CM | POA: Diagnosis not present

## 2022-08-06 DIAGNOSIS — I6521 Occlusion and stenosis of right carotid artery: Secondary | ICD-10-CM | POA: Diagnosis not present

## 2022-08-06 LAB — BASIC METABOLIC PANEL
Anion gap: 7 (ref 5–15)
BUN: 30 mg/dL — ABNORMAL HIGH (ref 8–23)
CO2: 23 mmol/L (ref 22–32)
Calcium: 9.3 mg/dL (ref 8.9–10.3)
Chloride: 111 mmol/L (ref 98–111)
Creatinine, Ser: 1.39 mg/dL — ABNORMAL HIGH (ref 0.44–1.00)
GFR, Estimated: 38 mL/min — ABNORMAL LOW (ref >60)
Glucose, Bld: 93 mg/dL (ref 70–99)
Potassium: 3.6 mmol/L (ref 3.5–5.1)
Sodium: 141 mmol/L (ref 135–145)

## 2022-08-06 LAB — CBC
HCT: 40.6 % (ref 36.0–46.0)
Hemoglobin: 13.5 g/dL (ref 12.0–15.0)
MCH: 29 pg (ref 26.0–34.0)
MCHC: 33.3 g/dL (ref 30.0–36.0)
MCV: 87.3 fL (ref 80.0–100.0)
Platelets: 168 10*3/uL (ref 150–400)
RBC: 4.65 MIL/uL (ref 3.87–5.11)
RDW: 12.4 % (ref 11.5–15.5)
WBC: 6.7 10*3/uL (ref 4.0–10.5)
nRBC: 0 % (ref 0.0–0.2)

## 2022-08-06 LAB — CBG MONITORING, ED: Glucose-Capillary: 93 mg/dL (ref 70–99)

## 2022-08-06 LAB — TROPONIN I (HIGH SENSITIVITY): Troponin I (High Sensitivity): 5 ng/L (ref <18)

## 2022-08-06 LAB — BRAIN NATRIURETIC PEPTIDE: B Natriuretic Peptide: 112.1 pg/mL — ABNORMAL HIGH (ref 0.0–100.0)

## 2022-08-06 LAB — LACTIC ACID, PLASMA: Lactic Acid, Venous: 2 mmol/L (ref 0.5–1.9)

## 2022-08-06 LAB — PROTIME-INR
INR: 1.2 (ref 0.8–1.2)
Prothrombin Time: 15.1 seconds (ref 11.4–15.2)

## 2022-08-06 LAB — HEPARIN LEVEL (UNFRACTIONATED): Heparin Unfractionated: 0.73 IU/mL — ABNORMAL HIGH (ref 0.30–0.70)

## 2022-08-06 LAB — APTT: aPTT: 24 seconds (ref 24–36)

## 2022-08-06 MED ORDER — ONDANSETRON HCL 4 MG/2ML IJ SOLN: 4.0000 mg | Freq: Four times a day (QID) | INTRAMUSCULAR | Status: DC | PRN

## 2022-08-06 MED ORDER — IOHEXOL 350 MG/ML SOLN
75.0000 mL | Freq: Once | INTRAVENOUS | Status: AC | PRN
  Administered 2022-08-06: 75 mL via INTRAVENOUS

## 2022-08-06 MED ORDER — HYDRALAZINE HCL 20 MG/ML IJ SOLN: 5.0000 mg | INTRAMUSCULAR | Status: DC | PRN

## 2022-08-06 MED ORDER — MIRTAZAPINE 15 MG PO TABS
15.0000 mg | ORAL_TABLET | Freq: Every day | ORAL | Status: DC
  Administered 2022-08-06 – 2022-08-08 (×3): 15 mg via ORAL
  Filled 2022-08-06 (×3): qty 1

## 2022-08-06 MED ORDER — SODIUM CHLORIDE 0.9% FLUSH
3.0000 mL | Freq: Two times a day (BID) | INTRAVENOUS | Status: DC
  Administered 2022-08-06 – 2022-08-08 (×5): 3 mL via INTRAVENOUS

## 2022-08-06 MED ORDER — ONDANSETRON HCL 4 MG PO TABS: 4.0000 mg | ORAL_TABLET | Freq: Four times a day (QID) | ORAL | Status: DC | PRN

## 2022-08-06 MED ORDER — HEPARIN BOLUS VIA INFUSION
3900.0000 [IU] | Freq: Once | INTRAVENOUS | Status: AC
  Administered 2022-08-06: 3900 [IU] via INTRAVENOUS

## 2022-08-06 MED ORDER — LACTATED RINGERS IV BOLUS
1000.0000 mL | Freq: Once | INTRAVENOUS | Status: AC
  Administered 2022-08-06: 1000 mL via INTRAVENOUS

## 2022-08-06 MED ORDER — ACETAMINOPHEN 325 MG PO TABS
650.0000 mg | ORAL_TABLET | Freq: Four times a day (QID) | ORAL | Status: DC | PRN
  Administered 2022-08-08: 650 mg via ORAL
  Filled 2022-08-06: qty 2

## 2022-08-06 MED ORDER — ACETAMINOPHEN 650 MG RE SUPP: 650.0000 mg | Freq: Four times a day (QID) | RECTAL | Status: DC | PRN

## 2022-08-06 MED ORDER — HEPARIN (PORCINE) 25000 UT/250ML-% IV SOLN
850.0000 [IU]/h | INTRAVENOUS | Status: DC
  Administered 2022-08-06: 1100 [IU]/h via INTRAVENOUS
  Administered 2022-08-07: 850 [IU]/h via INTRAVENOUS
  Filled 2022-08-06 (×2): qty 250

## 2022-08-06 MED ORDER — MEMANTINE HCL 10 MG PO TABS
10.0000 mg | ORAL_TABLET | Freq: Two times a day (BID) | ORAL | Status: DC
  Administered 2022-08-06 – 2022-08-09 (×6): 10 mg via ORAL
  Filled 2022-08-06 (×6): qty 1

## 2022-08-06 MED ORDER — IOHEXOL 350 MG/ML SOLN: 75.0000 mL | Freq: Once | INTRAVENOUS | Status: DC | PRN

## 2022-08-06 MED ORDER — HYDROXYZINE HCL 10 MG PO TABS
10.0000 mg | ORAL_TABLET | Freq: Three times a day (TID) | ORAL | Status: DC | PRN
  Administered 2022-08-06: 10 mg via ORAL
  Filled 2022-08-06: qty 1

## 2022-08-06 NOTE — ED Provider Notes (Signed)
Ashley Black   CSN: [id number] Arrival date & time: 08/06/22  1143     History  Chief Complaint  Patient presents with   Leg Swelling    Ashley Black is a 81 y.o. female.  81 year old female with a history of bradycardia, hyperlipidemia, depression who presents to the emergency department with left lower extremity swelling.  Says that she noticed that this morning when she awoke.  Is accompanied by her daughter who verifies that it was not present yesterday.  Has swelling of the entire leg without injury.  Says that she feels a tingling sensation going down her leg but still has intact sensation.  No weakness or coolness of her leg.  No significant pain.  Denies any history of DVT/PE, cancer, hormone use, or recent surgery.  Denies any chest pain.  Thinks she may have had some shortness of breath but no cough recently.       Home Medications Prior to Admission medications   Medication Sig Start Date End Date Taking? Authorizing Provider  ALPRAZolam (XANAX) 0.25 MG tablet Take 0.125-0.25 mg by mouth 2 (two) times daily as needed. Patient not taking: Reported on 05/06/2022 04/29/20   [provider]  Cyanocobalamin (VITAMIN B-12) 2500 MCG SUBL 1 tablet 08/19/19   [provider]  Loratadine (CLARITIN PO) Take by mouth daily.    [provider]  memantine (NAMENDA) 10 MG tablet TAKE 1 TABLET BY MOUTH TWICE A DAY 05/30/22   Shawn Route, Coralee Pesa, PA-C  mirtazapine (REMERON) 15 MG tablet Take 15 mg by mouth at bedtime. 03/17/22   [provider]  Multiple Vitamins-Minerals (ICAPS AREDS 2 PO) Take 1 tablet by mouth in the morning and at bedtime.    [provider]      Allergies    Patient has no known allergies.    Review of Systems   Review of Systems  Physical Exam Updated Vital Signs BP 128/62   Pulse (!) 52   Temp (!) 97.5 F (36.4 C)   Resp (!) 22   Ht '5\' 5"'$  (1.651 m)   Wt 66.7 kg    SpO2 98%   BMI 24.46 kg/m  Physical Exam Vitals and nursing Black reviewed.  Constitutional:      General: She is not in acute distress.    Appearance: She is well-developed.  HENT:     Head: Normocephalic and atraumatic.     Right Ear: External ear normal.     Left Ear: External ear normal.     Nose: Nose normal.  Eyes:     Extraocular Movements: Extraocular movements intact.     Conjunctiva/sclera: Conjunctivae normal.     Pupils: Pupils are equal, round, and reactive to light.  Cardiovascular:     Rate and Rhythm: Normal rate and regular rhythm.  Pulmonary:     Effort: Pulmonary effort is normal. No respiratory distress.  Musculoskeletal:        General: No swelling.     Cervical back: Normal range of motion and neck supple.     Right lower leg: No edema.     Left lower leg: Edema (3+ to thigh) present.     Comments: Dopplerable pulse and left lower extremity.  Intact sensation to light touch in bilateral lower extremities.  Full strength in left lower extremity.  No pain to active range of motion of left ankle.  Foot warm and well-perfused and left lower extremity.  Skin:  General: Skin is warm and dry.     Capillary Refill: Capillary refill takes less than 2 seconds.  Neurological:     Mental Status: She is alert and oriented to person, place, and time. Mental status is at baseline.  Psychiatric:        Mood and Affect: Mood normal.     ED Results / Procedures / Treatments   Labs (all labs ordered are listed, but only abnormal results are displayed) Labs Reviewed  BASIC METABOLIC PANEL - Abnormal; Notable for the following components:      Result Value   BUN 30 (*)    Creatinine, Ser 1.39 (*)    GFR, Estimated 38 (*)    All other components within normal limits  LACTIC ACID, PLASMA - Abnormal; Notable for the following components:   Lactic Acid, Venous 2.0 (*)    All other components within normal limits  BRAIN NATRIURETIC PEPTIDE - Abnormal; Notable for the  following components:   B Natriuretic Peptide 112.1 (*)    All other components within normal limits  CBC  PROTIME-INR  APTT  LACTIC ACID, PLASMA  TROPONIN I (HIGH SENSITIVITY)    EKG EKG Interpretation  Date/Time:  Saturday August 06 2022 13:46:35 EST Ventricular Rate:  140 PR Interval:    QRS Duration: 83 QT Interval:  247 QTC Calculation: 366 R Axis:   -21 Text Interpretation: Atrial fibrillation Ventricular premature complex Inferior infarct, old Lateral leads are also involved Confirmed by Margaretmary Eddy [phone]) on 08/06/2022 1:50:26 PM  Radiology CT ABDOMEN PELVIS W CONTRAST  Result Date: 08/06/2022 CLINICAL DATA:  Left lower extremity pain and swelling, known left lower extremity DVT EXAM: CT ABDOMEN AND PELVIS WITH CONTRAST TECHNIQUE: Multidetector CT imaging of the abdomen and pelvis was performed using the standard protocol following bolus administration of intravenous contrast. RADIATION DOSE REDUCTION: This exam was performed according to the departmental dose-optimization program which includes automated exposure control, adjustment of the mA and/or kV according to patient size and/or use of iterative reconstruction technique. CONTRAST:  13m OMNIPAQUE IOHEXOL 350 MG/ML SOLN COMPARISON:  None Available. FINDINGS: Lower chest: Please see separately reported examination of the chest. Hepatobiliary: No solid liver abnormality is seen. Gallstone in the gallbladder fundus. No gallbladder wall thickening, or biliary dilatation. Pancreas: Unremarkable. No pancreatic ductal dilatation or surrounding inflammatory changes. Spleen: Normal in size without significant abnormality. Adrenals/Urinary Tract: Adrenal glands are unremarkable. Kidneys are normal, without renal calculi, solid lesion, or hydronephrosis. Bladder is unremarkable. Stomach/Bowel: Stomach is within normal limits. Appendix appears normal. No evidence of bowel wall thickening, distention, or inflammatory changes.  Moderate burden of stool and stool balls throughout the colon and rectum. Vascular/Lymphatic: Aortic atherosclerosis. Long segment, occlusive deep venous thrombosis involving the included left femoral veins, left internal, external, and common iliac veins, and the inferior confluence of the IVC (series 4, image 41). The right-sided pelvic veins appear patent. No enlarged abdominal or pelvic lymph nodes. Reproductive: Status post hysterectomy. Other: No abdominal wall hernia or abnormality. No ascites. Musculoskeletal: No acute or significant osseous findings. IMPRESSION: 1. Long segment, occlusive deep venous thrombosis involving the included left femoral veins, left internal, external, and common iliac veins, and the inferior confluence of the IVC. The right-sided pelvic veins appear patent. 2. Cholelithiasis. 3. Status post hysterectomy. Findings discussed by telephone with Dr. PSharlett Iles 2:20 p.m., 08/06/2022 Aortic Atherosclerosis (ICD10-I70.0). Electronically Signed   By: ADelanna AhmadiM.D.   On: 08/06/2022 14:30   CT Angio Chest PE W and/or Wo  Contrast  Result Date: 08/06/2022 CLINICAL DATA:  PE suspected, known extensive deep venous thrombosis of the left lower extremity EXAM: CT ANGIOGRAPHY CHEST WITH CONTRAST TECHNIQUE: Multidetector CT imaging of the chest was performed using the standard protocol during bolus administration of intravenous contrast. Multiplanar CT image reconstructions and MIPs were obtained to evaluate the vascular anatomy. RADIATION DOSE REDUCTION: This exam was performed according to the departmental dose-optimization program which includes automated exposure control, adjustment of the mA and/or kV according to patient size and/or use of iterative reconstruction technique. CONTRAST:  62m OMNIPAQUE IOHEXOL 350 MG/ML SOLN COMPARISON:  None Available. FINDINGS: Cardiovascular: Satisfactory opacification of the pulmonary arteries to the segmental level. No evidence of pulmonary  embolism. Heart at the upper limit of normal in size. Left coronary artery calcifications. Enlargement of the main pulmonary artery measuring up to 3.5 cm in caliber. No pericardial effusion. Scattered aortic atherosclerosis. Mediastinum/Nodes: No enlarged mediastinal, hilar, or axillary lymph nodes. Thyroid gland, trachea, and esophagus demonstrate no significant findings. Lungs/Pleura: Lungs are clear. No pleural effusion or pneumothorax. Upper Abdomen: Please see separately reported examination of the abdomen and pelvis. Musculoskeletal: No chest wall abnormality. No acute osseous findings. Review of the MIP images confirms the above findings. IMPRESSION: 1. Negative examination for pulmonary embolism. 2. Enlargement of the main pulmonary artery, as can be seen in pulmonary hypertension. 3. Coronary artery disease. Aortic Atherosclerosis (ICD10-I70.0). Electronically Signed   By: ADelanna AhmadiM.D.   On: 08/06/2022 14:22   UK[place]Venous Img Lower Unilateral Left  Result Date: 08/06/2022 CLINICAL DATA:  Left lower extremity pain and swelling. EXAM: Left LOWER EXTREMITY VENOUS DOPPLER ULTRASOUND TECHNIQUE: Gray-scale sonography with graded compression, as well as color Doppler and duplex ultrasound were performed to evaluate the lower extremity deep venous systems from the level of the common femoral vein and including the common femoral, femoral, profunda femoral, popliteal and calf veins including the posterior tibial, peroneal and gastrocnemius veins when visible. The superficial great saphenous vein was also interrogated. Spectral Doppler was utilized to evaluate flow at rest and with distal augmentation maneuvers in the common femoral, femoral and popliteal veins. COMPARISON:  None Available. FINDINGS: Contralateral Common Femoral Vein: Respiratory phasicity is normal and symmetric with the symptomatic side. No evidence of thrombus. Normal compressibility. Common Femoral Vein: Occlusive thrombus.  Saphenofemoral Junction: No evidence of thrombus. Normal compressibility and flow on color Doppler imaging. Profunda Femoral Vein: Occlusive thrombus. Femoral Vein: Occlusive thrombus. Popliteal Vein: Occlusive thrombus. Calf Veins: The posterior tibial vein is patent. No thrombus. The peroneal vein is not identified. Superficial Great Saphenous Vein: No evidence of thrombus. Normal compressibility. Venous Reflux:  None. Other Findings: Extensive subcutaneous edema in the left lower extremity. IMPRESSION: Extensive occlusive deep venous thrombosis in the left lower extremity as detailed above. These results will be called to the ordering clinician or representative by the Radiologist Assistant, and communication documented in the PACS or [employer] Electronically Signed   By: PMarijo SanesM.D.   On: 08/06/2022 13:33    Procedures Procedures   Ultrasound Guided Peripheral IV Indication: difficult to access - nursing staff unable to secure adequate peripheral IV access Location: R Antecubital Catheter Size: 18 G  Static views used to identify the target vein then usual prep with Chloraprep. IV placed on first attempt under dynamic UKoreaguidance. Dark red flash noted, and catheter advanced smoothly into the vein. NS saline flushed without resistance, witnessed swelling, or patient discomfort. IV secured with I-site and tegaderm. The patient tolerated  the procedure well. Performed By: Margaretmary Eddy MD  Medications Ordered in ED Medications  heparin ADULT infusion 100 units/mL (25000 units/263m) (1,100 Units/hr Intravenous New Bag/Given 08/06/22 1416)  heparin bolus via infusion 3,900 Units (3,900 Units Intravenous Bolus from Bag 08/06/22 1417)  iohexol (OMNIPAQUE) 350 MG/ML injection 75 mL (75 mLs Intravenous Contrast Given 08/06/22 1401)    ED Course/ Medical Decision Making/ A&P Clinical Course as of 08/06/22 1612  Sat Aug 06, 2022  1422 [facility]  [facility].  [RP]  189Dr BTrula Sladefrom vascular surgery. Recommends admission to hospital service with leg elevation and continuing heparin gtt. Will see when she gets to [facility]. Plans on intervening on Monday.  [RP]    Clinical Course User Index [RP] PFransico Meadow MD                           Medical Decision Making Amount and/or Complexity of Data Reviewed Labs: ordered. Radiology: ordered.  Risk Prescription drug management.   JISHIA TENORIOis a 81y.o. female with comorbidities that complicate the patient evaluation including bradycardia, hyperlipidemia, depression who presents with chief complaint of left lower extremity swelling.  Initial Ddx:  DVT, phlegmasia cerulea dolens, May Thurner syndrome, PE, cellulitis  MDM:  Peers the patient has a DVT of her left lower extremity.  Does have significant swelling of the entire leg but is neurovascularly intact and does not have any features of compartment syndrome or PCD currently.  Will obtain ultrasound to assess for DVT.  If this is negative we will consider CT imaging for May-Thurner syndrome.  Does not appear to have erythema or warmth and patient does not have infectious symptoms that would be concerning for cellulitis.  Considered pulmonary embolism given the patient's questionable shortness of breath but no chest pain and is hemodynamically stable so will reassess the patient and see if CTA is indicated after ultrasound.   Plan:  Labs Coags Duplex ultrasound left lower extremity  ED Summary/Re-evaluation:  Patient had an ultrasound of her left lower extremity which did show extensive clot burden.  Shortly after the ultrasound resulted patient became tachycardic into the 140s.  Evaluated at the bedside and did not appear to be in acute distress with blood pressure low that was WNL.  EKG was obtained which showed atrial fibrillation with RVR at 140 bpm.  Patient was transported immediately to the CT scanner with a CTA of the  chest that did not show evidence of pulmonary emboli but CT of the abdomen pelvis that did show clot that extended into the distal IVC.  Consulted vascular surgery who [facility].  Given the extensive clot and episodes of tachycardia feel that patient is at high risk for pulmonary emboli so [facility].  [facility] please have vascular surgery see the patient and if stable admit to medicine for anticoagulation and thrombectomy tentatively planned for Monday.  This patient presents to the ED for concern of complaints listed in HPI, this involves an extensive number of treatment options, and is a complaint that carries with it a high risk of complications and morbidity. Disposition including potential need for admission considered.   [facility]  Additional history obtained from daughter Records reviewed Outpatient Clinic Notes The following labs were  independently interpreted: Chemistry and show AKI I independently reviewed the following imaging with scope of interpretation limited to determining acute life threatening conditions related to emergency care: CT Abdomen/Pelvis, which revealed  blood clot in IVC   I personally reviewed and interpreted cardiac monitoring: normal sinus rhythm  and sinus tachycardia I personally reviewed and interpreted the pt's EKG: see above for interpretation  I have reviewed the patients home medications and made adjustments as needed Consults: Vascular Surgery  Final Clinical Impression(s) / ED Diagnoses Final diagnoses:  Acute deep vein thrombosis (DVT) of iliac vein of left lower extremity [facility]    Rx / DC Orders ED Discharge Orders     None         Fransico Meadow, MD 08/06/22 [phone]

## 2022-08-06 NOTE — ED Triage Notes (Signed)
Patient reports woke up today with left leg swelling and tingling sensation. Patient reports shortness of breath.

## 2022-08-06 NOTE — H&P (Signed)
History and Physical    Patient: Ashley Black BSW:[id number] DOB: 03-05-1941 DOA: 08/06/2022 DOS: the patient was seen and examined on 08/06/2022 PCP: Harlan Stains, MD  Patient coming from: Home - lives with husband; NOK: Daughter, Ubaldo Glassing, [phone]   Chief Complaint: LLE edema  HPI: Ashley Black is a 81 y.o. female with medical history significant of bradycardia, dementia, and HLD presenting with LLE edema.  She reports that she awoke this AM with LLE numbness/tingling and edema.  She did not notice issues prior.  No recent travel, surgery, fractures.  She does report that she sits for long periods of time and is less active than she used to be.  No SOB.  En route, there was some concern about L facial droop and she denies knowledge of this - but it is present in her Epic photo (which was not taken today).  She also denies h/o afib or any other cardiac problems; she vaguely remembers a long-ago evaluation for bradycardia when prompted.    ER Course:  LLE edema, new onset afib.  Started on heparin, vascular surgery thinks maybe thrombectomy Monday.  Continue heparin through Monday.  Concern for L facial droop on presentation, code stroke called but canceled.       Review of Systems: As mentioned in the history of present illness. All other systems reviewed and are negative. Past Medical History:  Diagnosis Date   Bradycardia    testing done 2012 no cause found   Dizziness    brief occasions   Dyslipidemia    Edema of both legs    very rare   History of hysterectomy 1985   Pneumonia    few times, none recent   Past Surgical History:  Procedure Laterality Date   ABDOMINAL HYSTERECTOMY      1 ovary removed   back disc 5-6  1997   upper back   FLEXIBLE SIGMOIDOSCOPY N/A 06/21/2016   Procedure: FLEXIBLE SIGMOIDOSCOPY;  Surgeon: Garlan Fair, MD;  Location: [facility];  Service: Endoscopy;  Laterality: N/A;   Social History:  reports that she quit  smoking about 34 years ago. Her smoking use included cigarettes. She has never used smokeless tobacco. She reports that she does not drink alcohol and does not use drugs.  No Known Allergies  Family History  Problem Relation Age of Onset   Heart disease Mother     Prior to Admission medications   Medication Sig Start Date End Date Taking? Authorizing Provider  ALPRAZolam (XANAX) 0.25 MG tablet Take 0.125-0.25 mg by mouth 2 (two) times daily as needed. Patient not taking: Reported on 05/06/2022 04/29/20   [provider]  Cyanocobalamin (VITAMIN B-12) 2500 MCG SUBL 1 tablet 08/19/19   [provider]  Loratadine (CLARITIN PO) Take by mouth daily.    [provider]  memantine (NAMENDA) 10 MG tablet TAKE 1 TABLET BY MOUTH TWICE A DAY 05/30/22   Shawn Route, Coralee Pesa, PA-C  mirtazapine (REMERON) 15 MG tablet Take 15 mg by mouth at bedtime. 03/17/22   [provider]  Multiple Vitamins-Minerals (ICAPS AREDS 2 PO) Take 1 tablet by mouth in the morning and at bedtime.    [provider]    Physical Exam: Vitals:   08/06/22 1428 08/06/22 1432 08/06/22 1745 08/06/22 1758  BP: (!) 129/107 128/62 (!) 145/57   Pulse: (!) 112 (!) 52 (!) 42   Resp: (!) 22 (!) 22 16   Temp:    98.4 F (36.9 C)  TempSrc:    Oral  SpO2: 100% 98% 98%   Weight:      Height:       General:  Appears calm and comfortable and is in NAD, on RA Eyes:   EOMI, normal lids, iris ENT:  grossly normal hearing, lips & tongue, mmm; artificial upper dentition Neck:  no LAD, masses or thyromegaly Cardiovascular:  Irregularly irregular with bradycardia in the 40s, no m/r/g.  Respiratory:   CTA bilaterally with no wheezes/rales/rhonchi.  Normal respiratory effort. Abdomen:  soft, NT, ND Skin:  no rash or induration seen on limited exam Musculoskeletal:  Taut LLE edema diffusely along the entirety of the leg Psychiatric:  grossly normal mood and affect, speech fluent and appropriate,  AOx3 Neurologic:  CN 2-12 grossly intact other than mild L facial droop that appears to be chronic, moves all extremities in coordinated fashion   Radiological Exams on Admission: Independently reviewed - see discussion in A/P where applicable  CT HEAD CODE STROKE WO CONTRAST  Result Date: 08/06/2022 CLINICAL DATA:  Neuro deficit, stroke suspected. EXAM: CT ANGIOGRAPHY HEAD AND NECK TECHNIQUE: Multidetector CT imaging of the head and neck was performed using the standard protocol during bolus administration of intravenous contrast. Multiplanar CT image reconstructions and MIPs were obtained to evaluate the vascular anatomy. Carotid stenosis measurements (when applicable) are obtained utilizing NASCET criteria, using the distal internal carotid diameter as the denominator. RADIATION DOSE REDUCTION: This exam was performed according to the departmental dose-optimization program which includes automated exposure control, adjustment of the mA and/or kV according to patient size and/or use of iterative reconstruction technique. CONTRAST:  39m OMNIPAQUE IOHEXOL 350 MG/ML SOLN COMPARISON:  Brain MRI 07/08/2020, carotid Doppler 06/12/2017 FINDINGS: CT HEAD FINDINGS Brain: There is no acute intracranial hemorrhage, extra-axial fluid collection, or acute infarct. ASPECTS is 10. There is mild background parenchymal volume loss with prominence of the ventricular system and extra-axial CSF spaces. Gray-white differentiation is preserved. There is no mass lesion.  There is no mass effect or midline shift. Vascular: See below. Skull: Normal. Negative for fracture or focal lesion. Sinuses/Orbits: The paranasal sinuses are clear. Bilateral lens implants are in place. The globes and orbits are otherwise unremarkable. Other: None. Review of the MIP images confirms the above findings CTA NECK FINDINGS Aortic arch: The imaged aortic arch is normal. The origins of the major branch vessels are patent. The subclavian arteries  are patent to the level imaged. Right carotid system: The right common, internal, and external carotid arteries are patent, with mild plaque at the bifurcation but no hemodynamically significant stenosis or occlusion. There is no dissection or aneurysm. Left carotid system: The left common, internal, and external carotid arteries are patent with no significant stenosis or occlusion. There is no dissection or aneurysm. Vertebral arteries: The vertebral arteries are patent, without hemodynamically significant stenosis or occlusion. There is no dissection or aneurysm. Skeleton: There is no acute osseous abnormality or suspicious osseous lesion. There is no visible canal hematoma. Postsurgical changes reflecting C5 through C7 ACDF are noted without evidence of complication. Other neck: The soft tissues of the neck are unremarkable. Upper chest: The imaged lung apices are clear. Review of the MIP images confirms the above findings CTA HEAD FINDINGS Anterior circulation: The intracranial ICAs are patent. The bilateral MCAs are patent, without proximal stenosis or occlusion The bilateral ACAs are patent, without proximal stenosis or occlusion. The anterior communicating artery is normal There is no aneurysm or AVM. Posterior circulation: The bilateral V4 segments are  patent. The basilar artery is patent. The major cerebellar arteries are patent. The bilateral PCAs are patent. There is multifocal moderate stenosis of the right P1 and P2 segments. There is no other proximal stenosis or occlusion. Posterior communicating arteries are not definitely seen. There is no aneurysm or AVM. Venous sinuses: As permitted by contrast timing, patent. Anatomic variants: None. Review of the MIP images confirms the above findings IMPRESSION: 1. No acute intracranial pathology.  ASPECTS is 10. 2.  No emergent large vessel occlusion. 3. Multifocal moderate stenosis of the right PCA. Otherwise, no hemodynamically significant stenosis or  occlusion in the head or neck. Findings communicated to Dr. Leonel Ramsay via Amion at 5 p.m. Electronically Signed   By: Valetta Mole M.D.   On: 08/06/2022 17:05   CT ANGIO HEAD NECK W WO CM (CODE STROKE)  Result Date: 08/06/2022 CLINICAL DATA:  Neuro deficit, stroke suspected. EXAM: CT ANGIOGRAPHY HEAD AND NECK TECHNIQUE: Multidetector CT imaging of the head and neck was performed using the standard protocol during bolus administration of intravenous contrast. Multiplanar CT image reconstructions and MIPs were obtained to evaluate the vascular anatomy. Carotid stenosis measurements (when applicable) are obtained utilizing NASCET criteria, using the distal internal carotid diameter as the denominator. RADIATION DOSE REDUCTION: This exam was performed according to the departmental dose-optimization program which includes automated exposure control, adjustment of the mA and/or kV according to patient size and/or use of iterative reconstruction technique. CONTRAST:  67m OMNIPAQUE IOHEXOL 350 MG/ML SOLN COMPARISON:  Brain MRI 07/08/2020, carotid Doppler 06/12/2017 FINDINGS: CT HEAD FINDINGS Brain: There is no acute intracranial hemorrhage, extra-axial fluid collection, or acute infarct. ASPECTS is 10. There is mild background parenchymal volume loss with prominence of the ventricular system and extra-axial CSF spaces. Gray-white differentiation is preserved. There is no mass lesion.  There is no mass effect or midline shift. Vascular: See below. Skull: Normal. Negative for fracture or focal lesion. Sinuses/Orbits: The paranasal sinuses are clear. Bilateral lens implants are in place. The globes and orbits are otherwise unremarkable. Other: None. Review of the MIP images confirms the above findings CTA NECK FINDINGS Aortic arch: The imaged aortic arch is normal. The origins of the major branch vessels are patent. The subclavian arteries are patent to the level imaged. Right carotid system: The right common,  internal, and external carotid arteries are patent, with mild plaque at the bifurcation but no hemodynamically significant stenosis or occlusion. There is no dissection or aneurysm. Left carotid system: The left common, internal, and external carotid arteries are patent with no significant stenosis or occlusion. There is no dissection or aneurysm. Vertebral arteries: The vertebral arteries are patent, without hemodynamically significant stenosis or occlusion. There is no dissection or aneurysm. Skeleton: There is no acute osseous abnormality or suspicious osseous lesion. There is no visible canal hematoma. Postsurgical changes reflecting C5 through C7 ACDF are noted without evidence of complication. Other neck: The soft tissues of the neck are unremarkable. Upper chest: The imaged lung apices are clear. Review of the MIP images confirms the above findings CTA HEAD FINDINGS Anterior circulation: The intracranial ICAs are patent. The bilateral MCAs are patent, without proximal stenosis or occlusion The bilateral ACAs are patent, without proximal stenosis or occlusion. The anterior communicating artery is normal There is no aneurysm or AVM. Posterior circulation: The bilateral V4 segments are patent. The basilar artery is patent. The major cerebellar arteries are patent. The bilateral PCAs are patent. There is multifocal moderate stenosis of the right P1 and P2 segments.  There is no other proximal stenosis or occlusion. Posterior communicating arteries are not definitely seen. There is no aneurysm or AVM. Venous sinuses: As permitted by contrast timing, patent. Anatomic variants: None. Review of the MIP images confirms the above findings IMPRESSION: 1. No acute intracranial pathology.  ASPECTS is 10. 2.  No emergent large vessel occlusion. 3. Multifocal moderate stenosis of the right PCA. Otherwise, no hemodynamically significant stenosis or occlusion in the head or neck. Findings communicated to Dr. Leonel Ramsay via  Amion at 5 p.m. Electronically Signed   By: Valetta Mole M.D.   On: 08/06/2022 17:05   CT ABDOMEN PELVIS W CONTRAST  Result Date: 08/06/2022 CLINICAL DATA:  Left lower extremity pain and swelling, known left lower extremity DVT EXAM: CT ABDOMEN AND PELVIS WITH CONTRAST TECHNIQUE: Multidetector CT imaging of the abdomen and pelvis was performed using the standard protocol following bolus administration of intravenous contrast. RADIATION DOSE REDUCTION: This exam was performed according to the departmental dose-optimization program which includes automated exposure control, adjustment of the mA and/or kV according to patient size and/or use of iterative reconstruction technique. CONTRAST:  60m OMNIPAQUE IOHEXOL 350 MG/ML SOLN COMPARISON:  None Available. FINDINGS: Lower chest: Please see separately reported examination of the chest. Hepatobiliary: No solid liver abnormality is seen. Gallstone in the gallbladder fundus. No gallbladder wall thickening, or biliary dilatation. Pancreas: Unremarkable. No pancreatic ductal dilatation or surrounding inflammatory changes. Spleen: Normal in size without significant abnormality. Adrenals/Urinary Tract: Adrenal glands are unremarkable. Kidneys are normal, without renal calculi, solid lesion, or hydronephrosis. Bladder is unremarkable. Stomach/Bowel: Stomach is within normal limits. Appendix appears normal. No evidence of bowel wall thickening, distention, or inflammatory changes. Moderate burden of stool and stool balls throughout the colon and rectum. Vascular/Lymphatic: Aortic atherosclerosis. Long segment, occlusive deep venous thrombosis involving the included left femoral veins, left internal, external, and common iliac veins, and the inferior confluence of the IVC (series 4, image 41). The right-sided pelvic veins appear patent. No enlarged abdominal or pelvic lymph nodes. Reproductive: Status post hysterectomy. Other: No abdominal wall hernia or abnormality. No  ascites. Musculoskeletal: No acute or significant osseous findings. IMPRESSION: 1. Long segment, occlusive deep venous thrombosis involving the included left femoral veins, left internal, external, and common iliac veins, and the inferior confluence of the IVC. The right-sided pelvic veins appear patent. 2. Cholelithiasis. 3. Status post hysterectomy. Findings discussed by telephone with Dr. PSharlett Iles 2:20 p.m., 08/06/2022 Aortic Atherosclerosis (ICD10-I70.0). Electronically Signed   By: ADelanna AhmadiM.D.   On: 08/06/2022 14:30   CT Angio Chest PE W and/or Wo Contrast  Result Date: 08/06/2022 CLINICAL DATA:  PE suspected, known extensive deep venous thrombosis of the left lower extremity EXAM: CT ANGIOGRAPHY CHEST WITH CONTRAST TECHNIQUE: Multidetector CT imaging of the chest was performed using the standard protocol during bolus administration of intravenous contrast. Multiplanar CT image reconstructions and MIPs were obtained to evaluate the vascular anatomy. RADIATION DOSE REDUCTION: This exam was performed according to the departmental dose-optimization program which includes automated exposure control, adjustment of the mA and/or kV according to patient size and/or use of iterative reconstruction technique. CONTRAST:  732mOMNIPAQUE IOHEXOL 350 MG/ML SOLN COMPARISON:  None Available. FINDINGS: Cardiovascular: Satisfactory opacification of the pulmonary arteries to the segmental level. No evidence of pulmonary embolism. Heart at the upper limit of normal in size. Left coronary artery calcifications. Enlargement of the main pulmonary artery measuring up to 3.5 cm in caliber. No pericardial effusion. Scattered aortic atherosclerosis. Mediastinum/Nodes: No enlarged mediastinal, hilar,  or axillary lymph nodes. Thyroid gland, trachea, and esophagus demonstrate no significant findings. Lungs/Pleura: Lungs are clear. No pleural effusion or pneumothorax. Upper Abdomen: Please see separately reported examination  of the abdomen and pelvis. Musculoskeletal: No chest wall abnormality. No acute osseous findings. Review of the MIP images confirms the above findings. IMPRESSION: 1. Negative examination for pulmonary embolism. 2. Enlargement of the main pulmonary artery, as can be seen in pulmonary hypertension. 3. Coronary artery disease. Aortic Atherosclerosis (ICD10-I70.0). Electronically Signed   By: Delanna Ahmadi M.D.   On: 08/06/2022 14:22   US Venous Img Lower Unilateral Left  Result Date: 08/06/2022 CLINICAL DATA:  Left lower extremity pain and swelling. EXAM: Left LOWER EXTREMITY VENOUS DOPPLER ULTRASOUND TECHNIQUE: Gray-scale sonography with graded compression, as well as color Doppler and duplex ultrasound were performed to evaluate the lower extremity deep venous systems from the level of the common femoral vein and including the common femoral, femoral, profunda femoral, popliteal and calf veins including the posterior tibial, peroneal and gastrocnemius veins when visible. The superficial great saphenous vein was also interrogated. Spectral Doppler was utilized to evaluate flow at rest and with distal augmentation maneuvers in the common femoral, femoral and popliteal veins. COMPARISON:  None Available. FINDINGS: Contralateral Common Femoral Vein: Respiratory phasicity is normal and symmetric with the symptomatic side. No evidence of thrombus. Normal compressibility. Common Femoral Vein: Occlusive thrombus. Saphenofemoral Junction: No evidence of thrombus. Normal compressibility and flow on color Doppler imaging. Profunda Femoral Vein: Occlusive thrombus. Femoral Vein: Occlusive thrombus. Popliteal Vein: Occlusive thrombus. Calf Veins: The posterior tibial vein is patent. No thrombus. The peroneal vein is not identified. Superficial Great Saphenous Vein: No evidence of thrombus. Normal compressibility. Venous Reflux:  None. Other Findings: Extensive subcutaneous edema in the left lower extremity. IMPRESSION:  Extensive occlusive deep venous thrombosis in the left lower extremity as detailed above. These results will be called to the ordering clinician or representative by the Radiologist Assistant, and communication documented in the PACS or [employer]. Electronically Signed   By: Marijo Sanes M.D.   On: 08/06/2022 13:33    EKG: Independently reviewed.   1346 - Afib with rate 140; nonspecific ST changes with no evidence of acute ischemia 1756 - Afib with rate 41, no acute ischemia   Labs on Admission: I have personally reviewed the available labs and imaging studies at the time of the admission.  Pertinent labs:    BUN 30/Creatinine 1.39/GFR 38 BNP 112.1 HS troponin 5 Lactate 2.0 Normal CBC INR 1.2   Assessment and Plan: Principal Problem:   DVT (deep venous thrombosis) (HCC) Active Problems:   Dyslipidemia   New onset atrial fibrillation (HCC)   Dementia without behavioral disturbance (HCC)   Mouth droop due to facial weakness    LLE DVT -Patient without prior episodes of thromboembolic disease presenting with new extensive occlusive LLE DVT -No PE on imaging -Patient is not showing evidence of hemodynamic instability at this time -Will admit on telemetry. -Initiate anticoagulation - for now, will start treatment-dose heparin with plan to transition post-thrombectomy -Vascular surgery is consulting -She is planned for thrombectomy on Monday given the extensive nature of the clot -Kidder O2 as needed -Will request TOC team consultation to assist with cost analysis of the various DOAC options based on her insurance -Patients are at intermediate risk (3-8%/year) for recurrent VTE if initial clot occurred with no identifiable RF.  Extended oral anticoagulation of indefinite duration should be considered for patients with a first episode of PE  with these issues.  Left facial droop -Appears to be chronic -Neurology consulted and signed off after negative CT/CTA  New onset  afib -Reports no prior history of this issue -She did have a remote evaluation for bradycardia -She does not appear to be taking rate-controlling medications -HR is controlled with bradycardia without medications -Will need long-term AC therapy for this issue, as well  Dementia -Continue Namenda, mirtazepine -Will order delirium precautions  HLD -She does not appear to be taking medications for this issue at this time     Advance Care Planning:   Code Status: Full Code   Consults: Vascular surgery; neurology; TOC team  DVT Prophylaxis: Heparin drip  Family Communication: None present - her daughter has been with her throughout but had stepped away at the time of my evaluation  Severity of Illness: The appropriate patient status for this patient is INPATIENT. Inpatient status is judged to be reasonable and necessary in order to provide the required intensity of service to ensure the patient's safety. The patient's presenting symptoms, physical exam findings, and initial radiographic and laboratory data in the context of their chronic comorbidities is felt to place them at high risk for further clinical deterioration. Furthermore, it is not anticipated that the patient will be medically stable for discharge from the hospital within 2 midnights of admission.   * I certify that at the point of admission it is my clinical judgment that the patient will require inpatient hospital care spanning beyond 2 midnights from the point of admission due to high intensity of service, high risk for further deterioration and high frequency of surveillance required.*  Author: Karmen Bongo, MD 08/06/2022 6:42 PM  For on call review [email or web address].

## 2022-08-06 NOTE — Code Documentation (Signed)
Stroke Response Nurse Documentation Code Documentation  Ashley Black is a 81 y.o. female arriving to [facility]  via [facility] on 08/06/22 with past medical hx of atrial fib, DVT, HLD. On No antithrombotic. Code stroke was activated by EMS.   Patient from [facility] where she was LKW at unclear time and now complaining of left sided facial weakness and confusion.  Stroke team at the bedside on patient arrival. Labs drawn and patient cleared for CT by Dr. Zenia Resides. Patient to CT with team. NIHSS 2, see documentation for details and code stroke times. Patient with disoriented and left facial droop on exam. The following imaging was completed:  CT Head and CTA. Patient is not a candidate for IV Thrombolytic due to no LVO. Patient is not a candidate for IR due to low suspicion of LVO.   Care Plan: Code stroke cancelled at 1746.   Bedside handoff with ED RN Alana.    [place]  Stroke Response RN

## 2022-08-06 NOTE — ED Notes (Signed)
Code stroke canceled  7414 per neurology

## 2022-08-06 NOTE — Progress Notes (Signed)
ANTICOAGULATION CONSULT NOTE - Initial Consult  Pharmacy Consult for heparin Indication: pulmonary embolus  No Known Allergies  Patient Measurements: Height: '5\' 5"'$  (165.1 cm) Weight: 66.7 kg (147 lb) IBW/kg (Calculated) : 57 Heparin Dosing Weight: 66.7 kg  Vital Signs: Temp: 97.5 F (36.4 C) (11/25 1159) BP: 144/60 (11/25 1159) Pulse Rate: 128 (11/25 1345)  Labs: Recent Labs    08/06/22 1257  HGB 13.5  HCT 40.6  PLT 168  APTT 24  LABPROT 15.1  INR 1.2  CREATININE 1.39*    Estimated Creatinine Clearance: 28.1 mL/min (A) (by C-G formula based on SCr of 1.39 mg/dL (H)).   Medical History: Past Medical History:  Diagnosis Date   Bradycardia    testing done 2012 no cause found   Dizziness    brief occasions   Dyslipidemia    Edema of both legs    very rare   History of hysterectomy 1985   Pneumonia    few times, none recent    Assessment: 81 yo F presenting with SHOB and lef leg swelling. Pt found to have extensive occlusive DVT in L lower extremity on doppler. CT PE pending. Pharmacy consulted to manage heparin infusion in setting of VTE. No anticoagulants in fill hx  CBC: Hgb 13.5/Plt 168 , baseline aPTT 24  Goal of Therapy:  Heparin level 0.3-0.7 units/ml Monitor platelets by anticoagulation protocol: Yes   Plan:  Heparin 3900 units IV x1 followed by heparin 1100 units/hr 8hr HL at 2200 Daily HL, CBC F/u s/sx bleeding and transition to enteral therapies as able/appropiate  Wilson Singer, PharmD Clinical Pharmacist 08/06/2022 1:48 PM

## 2022-08-06 NOTE — ED Notes (Signed)
Pt is a transfer from [facility]. [facility] stated pt was confused in ambulance during transport and pt became increasingly confused as she got closer [facility]. While pt was in hall I noticed pt had a left facial droop and I asked [facility] if that was new and they stated yes. I notified Dr Zenia Resides and Dr Freda Munro of the changes and they came to bedside and assessed pt. A code stroke was activated on pt and this RN took pt to CT.

## 2022-08-06 NOTE — Progress Notes (Signed)
ANTICOAGULATION CONSULT NOTE  Pharmacy Consult for heparin Indication: pulmonary embolus  No Known Allergies  Patient Measurements: Height: '5\' 5"'$  (165.1 cm) Weight: 66.7 kg (147 lb) IBW/kg (Calculated) : 57 Heparin Dosing Weight: 66.7 kg  Vital Signs: Temp: 98.4 F (36.9 C) (11/25 1758) Temp Source: Oral (11/25 1758) BP: 136/69 (11/25 2145) Pulse Rate: 50 (11/25 2145)  Labs: Recent Labs    08/06/22 1257 08/06/22 1400 08/06/22 2152  HGB 13.5  --   --   HCT 40.6  --   --   PLT 168  --   --   APTT 24  --   --   LABPROT 15.1  --   --   INR 1.2  --   --   HEPARINUNFRC  --   --  0.73*  CREATININE 1.39*  --   --   TROPONINIHS  --  5  --      Estimated Creatinine Clearance: 28.1 mL/min (A) (by C-G formula based on SCr of 1.39 mg/dL (H)).   Medical History: Past Medical History:  Diagnosis Date   Bradycardia    testing done 2012 no cause found   Dizziness    brief occasions   Dyslipidemia    Edema of both legs    very rare   History of hysterectomy 1985   Pneumonia    few times, none recent    Assessment: 81 yo F presenting with SHOB and lef leg swelling. Pt found to have extensive occlusive DVT in L lower extremity on doppler. CT PE negative. Pharmacy consulted to manage heparin infusion in setting of VTE. No anticoagulants in fill hx  Heparin level came back slightly supratherapeutic at 0.73, on 1100 units/hr. No s/sx of bleeding or infusion issues.   Goal of Therapy:  Heparin level 0.3-0.7 units/ml Monitor platelets by anticoagulation protocol: Yes   Plan:  Reduce heparin infusion to 1000 units/hr Order heparin level in 8 hours  Daily HL, CBC F/u s/sx bleeding and transition to enteral therapies as able/appropiate  Antonietta Jewel, PharmD, BCCCP Clinical Pharmacist  Phone: [phone] 08/06/2022 10:34 PM  Please check AMION for all [place] phone numbers After 10:00 PM, call [place] [phone]

## 2022-08-06 NOTE — ED Notes (Signed)
Pt currently resting in bed. Updated about plan of care. Daughter at bedside. No needs or concerns voiced at this time.

## 2022-08-06 NOTE — Consult Note (Signed)
[facility] [place]  Patient name: Ashley Black MRN: [id number] DOB: 08-18-1941 Sex: female   REQUESTING PROVIDER:    [facility]   REASON FOR CONSULT:    Left leg DVT  HISTORY OF PRESENT ILLNESS:   Ashley Black is a 81 y.o. female, who presented to the [facility] after noticing a swollen left leg when she awoke this morning.  She is with her daughter who is a Immunologist at [employer].  She denies any trauma.  She denies any provocative issues such as prolonged immobility, long travel, or recent medication changes.  She has not had a prior DVT.  During transport via EMS, it was thought that she might have had a mouth droop and was somewhat confused and so she underwent a stroke work-up which was unremarkable.  She also had a PE work-up which was negative.  PAST MEDICAL HISTORY    Past Medical History:  Diagnosis Date   Bradycardia    testing done 2012 no cause found   Dizziness    brief occasions   Dyslipidemia    Edema of both legs    very rare   History of hysterectomy 1985   Pneumonia    few times, none recent     FAMILY HISTORY   Family History  Problem Relation Age of Onset   Heart disease Mother     SOCIAL HISTORY:   Social History   Socioeconomic History   Marital status: Married    Spouse name: Not on file   Number of children: Not on file   Years of education: Not on file   Highest education level: Not on file  Occupational History   Occupation: retired    Comment: International aid/development worker  Tobacco Use   Smoking status: Former    Types: Cigarettes    Quit date: 09/13/1987    Years since quitting: 34.9   Smokeless tobacco: Never  Substance and Sexual Activity   Alcohol use: No   Drug use: No   Sexual activity: Not on file  Other Topics Concern   Not on file  Social History Narrative   Left handed    Lives with husband    Social Determinants of Health   Financial Resource  Strain: Not on file  Food Insecurity: Not on file  Transportation Needs: Not on file  Physical Activity: Not on file  Stress: Not on file  Social Connections: Not on file  Intimate Partner Violence: Not on file    ALLERGIES:    No Known Allergies  CURRENT MEDICATIONS:    Current Facility-Administered Medications  Medication Dose Route Frequency Provider Last Rate Last Admin   heparin ADULT infusion 100 units/mL (25000 units/241m)  1,100 Units/hr Intravenous Continuous MWilson SingerI, RPH 11 mL/hr at 08/06/22 1416 1,100 Units/hr at 08/06/22 1416   Current Outpatient Medications  Medication Sig Dispense Refill   ALPRAZolam (XANAX) 0.25 MG tablet Take 0.125-0.25 mg by mouth 2 (two) times daily as needed. (Patient not taking: Reported on 05/06/2022)     Cyanocobalamin (VITAMIN B-12) 2500 MCG SUBL 1 tablet     Loratadine (CLARITIN PO) Take by mouth daily.     memantine (NAMENDA) 10 MG tablet TAKE 1 TABLET BY MOUTH TWICE A DAY 180 tablet 1   mirtazapine (REMERON) 15 MG tablet Take 15 mg by mouth at bedtime.     Multiple Vitamins-Minerals (ICAPS AREDS 2 PO) Take 1 tablet by mouth in the morning and at  bedtime.      REVIEW OF SYSTEMS:   '[X]'$  denotes positive finding, '[ ]'$  denotes negative finding Cardiac  Comments:  Chest pain or chest pressure:    Shortness of breath upon exertion:    Short of breath when lying flat:    Irregular heart rhythm:        Vascular    Pain in calf, thigh, or hip brought on by ambulation:    Pain in feet at night that wakes you up from your sleep:     Blood clot in your veins:    Leg swelling:  x       Pulmonary    Oxygen at home:    Productive cough:     Wheezing:         Neurologic    Sudden weakness in arms or legs:     Sudden numbness in arms or legs:     Sudden onset of difficulty speaking or slurred speech:    Temporary loss of vision in one eye:     Problems with dizziness:         Gastrointestinal    Blood in stool:       Vomited blood:         Genitourinary    Burning when urinating:     Blood in urine:        Psychiatric    Major depression:         Hematologic    Bleeding problems:    Problems with blood clotting too easily:        Skin    Rashes or ulcers:        Constitutional    Fever or chills:     PHYSICAL EXAM:   Vitals:   08/06/22 1156 08/06/22 1159 08/06/22 1345  BP:  (!) 144/60   Pulse:  (!) 57 (!) 128  Resp:  20 (!) 21  Temp:  (!) 97.5 F (36.4 C)   SpO2:  99% 99%  Weight: 66.7 kg    Height: '5\' 5"'$  (1.651 m)      GENERAL: The patient is a well-nourished female, in no acute distress. The vital signs are documented above. CARDIAC: There is a regular rate and rhythm.  VASCULAR: Left leg edema without evidence of ischemia.  There is significant fullness of the left leg throughout its entirety PULMONARY: Nonlabored respirations ABDOMEN: Soft and non-tender with normal pitched bowel sounds.  MUSCULOSKELETAL: There are no major deformities or cyanosis. NEUROLOGIC: No focal weakness or paresthesias are detected. SKIN: There are no ulcers or rashes noted. PSYCHIATRIC: The patient has a normal affect.  STUDIES:   I have reviewed the following: Extensive occlusive deep venous thrombosis in the left lower extremity as detailed above.   These results will be called to the ordering clinician or representative by the Radiologist Assistant, and communication documented in the PACS or [employer].    ASSESSMENT and PLAN   Left leg DVT: I discussed 2 options with the patient and her daughter.  The first would be anticoagulation.  The second would be percutaneous mechanical thrombectomy.  I discussed that because of the significant edema in her leg, I think that mechanical thrombectomy would be beneficial in preventing postphlebitic syndrome.  They are in agreement.  This will be from the left popliteal approach.  I discussed the details of the procedure as well as the risks  and benefits.  She will remain on IV heparin and we will plan on her  procedure Monday   Leia Alf, MD, FACS [facility] [phone] Pager [phone]

## 2022-08-06 NOTE — ED Provider Notes (Signed)
I saw and evaluated the patient, reviewed the resident's note and I agree with the findings and plan.  EKG Interpretation  Date/Time:  Saturday August 06 2022 13:46:35 EST Ventricular Rate:  140 PR Interval:    QRS Duration: 83 QT Interval:  247 QTC Calculation: 366 R Axis:   -21 Text Interpretation: Atrial fibrillation Ventricular premature complex Inferior infarct, old Lateral leads are also involved Confirmed by Margaretmary Eddy [phone]) on 08/06/2022 1:57:34 PM   81 year old female presents in transfer for evaluation of DVT.  Patient acute neurological changes.  Has new onset A-fib.  Patient is on heparin prior to arrival.  Code stroke initiated due to facial droop as well as field cut.  CT ordered.   Lacretia Leigh, MD 08/06/22 [phone]

## 2022-08-06 NOTE — ED Notes (Signed)
Elevated Lt leg

## 2022-08-06 NOTE — ED Notes (Signed)
Patient transported to CT, on cardiac monitor with RN

## 2022-08-06 NOTE — Consult Note (Signed)
Neurology Consultation Reason for Consult: Facial droop Referring Physician: Zenia Resides, a  CC: Facial droop  History is obtained from: Patient, daughter  HPI: Ashley Black is a 81 y.o. female who is presenting with new DVT as well as new onset atrial fibrillation who was being transported from [place].  In transit, the transporting team noticed that she had some left facial weakness and therefore a code stroke was activated.  There was also some concern for confusion.  Upon further inspection, her face is asymmetric at baseline as per her photograph in epic, and she has Alzheimer's, so forgetting the month is not unusual for her.  The patient feels that she is having no abnormalities, and the daughter feels that she appears close to her baseline as well.   LKW: Unclear, at least prior to her epic picture(taken 07/03/2020) tpa given?: no, no acute changes  Past Medical History:  Diagnosis Date   Bradycardia    testing done 2012 no cause found   Dizziness    brief occasions   Dyslipidemia    Edema of both legs    very rare   History of hysterectomy 1985   Pneumonia    few times, none recent     Family History  Problem Relation Age of Onset   Heart disease Mother      Social History:  reports that she quit smoking about 34 years ago. Her smoking use included cigarettes. She has never used smokeless tobacco. She reports that she does not drink alcohol and does not use drugs.   Exam: Current vital signs: BP 128/62   Pulse (!) 52   Temp (!) 97.5 F (36.4 C)   Resp (!) 22   Ht '5\' 5"'$  (1.651 m)   Wt 66.7 kg   SpO2 98%   BMI 24.46 kg/m  Vital signs in last 24 hours: Temp:  [97.5 F (36.4 C)] 97.5 F (36.4 C) (11/25 1159) Pulse Rate:  [52-128] 52 (11/25 1432) Resp:  [20-22] 22 (11/25 1432) BP: (128-144)/(60-107) 128/62 (11/25 1432) SpO2:  [98 %-100 %] 98 % (11/25 1432) Weight:  [66.7 kg] 66.7 kg (11/25 1156)   Physical Exam  Constitutional: Appears  well-developed and well-nourished.  Psych: Affect appropriate to situation Eyes: No scleral injection HENT: No OP obstruction MSK: no joint deformities.  Cardiovascular: Normal rate and regular rhythm.  Respiratory: Effort normal, non-labored breathing GI: Soft.  No distension. There is no tenderness.  Skin: WDI  Neuro: Mental Status: Patient is awake, alert, oriented to person and place, but unable to give month or year Cranial Nerves: II: Visual Fields are full in the right eye, in the left eye she has difficulty counting fingers in the left upper field, but she is able to identify finger wiggling. Pupils are equal, round, and reactive to light.   III,IV, VI: EOMI without ptosis or diploplia.  V: Facial sensation is symmetric to temperature VII: Facial movement with left facial weakness (present in epic photo) VIII: hearing is intact to voice X: Uvula elevates symmetrically XI: Shoulder shrug is symmetric. XII: tongue is midline without atrophy or fasciculations.  Motor: Tone is normal. Bulk is normal. 5/5 strength was present in all four extremities.  Sensory: Sensation is symmetric to light touch and temperature in the arms and legs. Cerebellar: No ataxia on finger-nose-finger   I have reviewed labs in epic and the results pertinent to this consultation are: Creatinine 1.39  I have reviewed the images obtained: CT/CTA-negative  Impression:  81 year old female presenting with DVT and atrial fibrillation.  Unfortunately she has several baseline issues that put together raise concern for stroke.  Her left facial weakness has been present least since July 03, 2020, there was some visual deficit, but this is monocular and more consistent with ophthalmic pathology, and also since she does not notice it very unlikely to be acute.  She was reported to be alert and oriented x 4, but her inability to answer orientation questions is not unusual given her Alzheimer's.  With negative  CT/CTA, my concern for any type of cerebrovascular acute change is very low.  I do not feel she needs any further neurological evaluation at this time.  Recommendations: 1) no further neurodiagnostic testing unless she develops further symptoms. 2) no objection to proceeding with any type of necessary procedures  3) neurology will be available as needed, please call with further questions or concerns   Roland Rack, MD [facility] [phone]  If 7pm- 7am, please page neurology on call as listed in [place].

## 2022-08-06 NOTE — ED Provider Notes (Signed)
[facility] EMERGENCY DEPARTMENT Provider Note   CSN: [id number] Arrival date & time: 08/06/22  1143  An emergency department physician performed an initial assessment on this suspected stroke patient at 1550.  History {Add pertinent medical, surgical, social history, OB history to HPI:1} Chief Complaint  Patient presents with   Leg Swelling    Ashley Black is a 81 y.o. female.  With PMH significant for bradycardia, HLD, presenting as a transfer from [place] for DVT of her left lower extremity.  Patient initially presented with concerns for left lower extremity pain.  There she was also noted to have episodes of A-fib with RVR.  CTA of the chest there did not show any pulmonary emboli, CT of the abdomen pelvis did show clot that extended to the distal IVC.  Vascular surgery was consulted.  Due to patient's clot burden she was transferred here.  On initial arrival, patient's critical care transport team was concerned for acute onset facial droop and confusion.  Patient's daughter arrived at bedside and noted that A-fib is new for the patient.    Home Medications Prior to Admission medications   Medication Sig Start Date End Date Taking? Authorizing Provider  Cyanocobalamin (VITAMIN B-12) 2500 MCG SUBL 1 tablet 08/19/19   [provider]  Loratadine (CLARITIN PO) Take by mouth daily.    [provider]  memantine (NAMENDA) 10 MG tablet TAKE 1 TABLET BY MOUTH TWICE A DAY 05/30/22   Shawn Route, Coralee Pesa, PA-C  mirtazapine (REMERON) 15 MG tablet Take 15 mg by mouth at bedtime. 03/17/22   [provider]  Multiple Vitamins-Minerals (ICAPS AREDS 2 PO) Take 1 tablet by mouth in the morning and at bedtime.    [provider]      Allergies    Patient has no known allergies.    Review of Systems   Review of Systems  Physical Exam Updated Vital Signs BP (!) 145/57   Pulse (!) 42   Temp 98.4 F (36.9 C) (Oral)   Resp 16   Ht  '5\' 5"'$  (1.651 m)   Wt 66.7 kg   SpO2 98%   BMI 24.46 kg/m  Physical Exam Vitals reviewed.  Constitutional:      General: She is not in acute distress.    Appearance: She is not ill-appearing, toxic-appearing or diaphoretic.  HENT:     Head: Normocephalic and atraumatic.     Nose: Nose normal.     Mouth/Throat:     Mouth: Mucous membranes are moist.     Pharynx: Oropharynx is clear. No oropharyngeal exudate or posterior oropharyngeal erythema.     Comments: Palate elevates symmetrically Eyes:     Extraocular Movements: Extraocular movements intact.     Conjunctiva/sclera: Conjunctivae normal.     Pupils: Pupils are equal, round, and reactive to light.     Comments: Left visual field cut, possibly just in left eye, patient has difficulty following instructions for this exam  Cardiovascular:     Rate and Rhythm: Bradycardia present.     Pulses: Normal pulses.     Heart sounds: Normal heart sounds.  Pulmonary:     Effort: Pulmonary effort is normal.     Breath sounds: Normal breath sounds.  Abdominal:     General: There is no distension.     Tenderness: There is no abdominal tenderness. There is no guarding or rebound.  Musculoskeletal:        General: Tenderness present.  Right lower leg: No edema.     Left lower leg: Edema present.     Comments: Left lower extremity exam: Significant swelling, calf TTP, DP intact, sensation intact, able to wiggle toes  Neurological:     Mental Status: She is alert.     ED Results / Procedures / Treatments   Labs (all labs ordered are listed, but only abnormal results are displayed) Labs Reviewed  BASIC METABOLIC PANEL - Abnormal; Notable for the following components:      Result Value   BUN 30 (*)    Creatinine, Ser 1.39 (*)    GFR, Estimated 38 (*)    All other components within normal limits  LACTIC ACID, PLASMA - Abnormal; Notable for the following components:   Lactic Acid, Venous 2.0 (*)    All other components within normal  limits  BRAIN NATRIURETIC PEPTIDE - Abnormal; Notable for the following components:   B Natriuretic Peptide 112.1 (*)    All other components within normal limits  CBC  PROTIME-INR  APTT  HEPARIN LEVEL (UNFRACTIONATED)  HEPARIN LEVEL (UNFRACTIONATED)  CBC  BASIC METABOLIC PANEL  CBG MONITORING, ED  CBG MONITORING, ED  TROPONIN I (HIGH SENSITIVITY)    EKG EKG Interpretation  Date/Time:  Saturday August 06 2022 13:46:35 EST Ventricular Rate:  140 PR Interval:    QRS Duration: 83 QT Interval:  247 QTC Calculation: 366 R Axis:   -21 Text Interpretation: Atrial fibrillation Ventricular premature complex Inferior infarct, old Lateral leads are also involved Confirmed by Margaretmary Eddy [phone]) on 08/06/2022 1:50:26 PM  Radiology CT HEAD CODE STROKE WO CONTRAST  Result Date: 08/06/2022 CLINICAL DATA:  Neuro deficit, stroke suspected. EXAM: CT ANGIOGRAPHY HEAD AND NECK TECHNIQUE: Multidetector CT imaging of the head and neck was performed using the standard protocol during bolus administration of intravenous contrast. Multiplanar CT image reconstructions and MIPs were obtained to evaluate the vascular anatomy. Carotid stenosis measurements (when applicable) are obtained utilizing NASCET criteria, using the distal internal carotid diameter as the denominator. RADIATION DOSE REDUCTION: This exam was performed according to the departmental dose-optimization program which includes automated exposure control, adjustment of the mA and/or kV according to patient size and/or use of iterative reconstruction technique. CONTRAST:  49m OMNIPAQUE IOHEXOL 350 MG/ML SOLN COMPARISON:  Brain MRI 07/08/2020, carotid Doppler 06/12/2017 FINDINGS: CT HEAD FINDINGS Brain: There is no acute intracranial hemorrhage, extra-axial fluid collection, or acute infarct. ASPECTS is 10. There is mild background parenchymal volume loss with prominence of the ventricular system and extra-axial CSF spaces. Gray-white  differentiation is preserved. There is no mass lesion.  There is no mass effect or midline shift. Vascular: See below. Skull: Normal. Negative for fracture or focal lesion. Sinuses/Orbits: The paranasal sinuses are clear. Bilateral lens implants are in place. The globes and orbits are otherwise unremarkable. Other: None. Review of the MIP images confirms the above findings CTA NECK FINDINGS Aortic arch: The imaged aortic arch is normal. The origins of the major branch vessels are patent. The subclavian arteries are patent to the level imaged. Right carotid system: The right common, internal, and external carotid arteries are patent, with mild plaque at the bifurcation but no hemodynamically significant stenosis or occlusion. There is no dissection or aneurysm. Left carotid system: The left common, internal, and external carotid arteries are patent with no significant stenosis or occlusion. There is no dissection or aneurysm. Vertebral arteries: The vertebral arteries are patent, without hemodynamically significant stenosis or occlusion. There is no dissection or aneurysm. Skeleton:  There is no acute osseous abnormality or suspicious osseous lesion. There is no visible canal hematoma. Postsurgical changes reflecting C5 through C7 ACDF are noted without evidence of complication. Other neck: The soft tissues of the neck are unremarkable. Upper chest: The imaged lung apices are clear. Review of the MIP images confirms the above findings CTA HEAD FINDINGS Anterior circulation: The intracranial ICAs are patent. The bilateral MCAs are patent, without proximal stenosis or occlusion The bilateral ACAs are patent, without proximal stenosis or occlusion. The anterior communicating artery is normal There is no aneurysm or AVM. Posterior circulation: The bilateral V4 segments are patent. The basilar artery is patent. The major cerebellar arteries are patent. The bilateral PCAs are patent. There is multifocal moderate stenosis of  the right P1 and P2 segments. There is no other proximal stenosis or occlusion. Posterior communicating arteries are not definitely seen. There is no aneurysm or AVM. Venous sinuses: As permitted by contrast timing, patent. Anatomic variants: None. Review of the MIP images confirms the above findings IMPRESSION: 1. No acute intracranial pathology.  ASPECTS is 10. 2.  No emergent large vessel occlusion. 3. Multifocal moderate stenosis of the right PCA. Otherwise, no hemodynamically significant stenosis or occlusion in the head or neck. Findings communicated to Dr. Leonel Ramsay via Amion at 5 p.m. Electronically Signed   By: Valetta Mole M.D.   On: 08/06/2022 17:05   CT ANGIO HEAD NECK W WO CM (CODE STROKE)  Result Date: 08/06/2022 CLINICAL DATA:  Neuro deficit, stroke suspected. EXAM: CT ANGIOGRAPHY HEAD AND NECK TECHNIQUE: Multidetector CT imaging of the head and neck was performed using the standard protocol during bolus administration of intravenous contrast. Multiplanar CT image reconstructions and MIPs were obtained to evaluate the vascular anatomy. Carotid stenosis measurements (when applicable) are obtained utilizing NASCET criteria, using the distal internal carotid diameter as the denominator. RADIATION DOSE REDUCTION: This exam was performed according to the departmental dose-optimization program which includes automated exposure control, adjustment of the mA and/or kV according to patient size and/or use of iterative reconstruction technique. CONTRAST:  40m OMNIPAQUE IOHEXOL 350 MG/ML SOLN COMPARISON:  Brain MRI 07/08/2020, carotid Doppler 06/12/2017 FINDINGS: CT HEAD FINDINGS Brain: There is no acute intracranial hemorrhage, extra-axial fluid collection, or acute infarct. ASPECTS is 10. There is mild background parenchymal volume loss with prominence of the ventricular system and extra-axial CSF spaces. Gray-white differentiation is preserved. There is no mass lesion.  There is no mass effect or  midline shift. Vascular: See below. Skull: Normal. Negative for fracture or focal lesion. Sinuses/Orbits: The paranasal sinuses are clear. Bilateral lens implants are in place. The globes and orbits are otherwise unremarkable. Other: None. Review of the MIP images confirms the above findings CTA NECK FINDINGS Aortic arch: The imaged aortic arch is normal. The origins of the major branch vessels are patent. The subclavian arteries are patent to the level imaged. Right carotid system: The right common, internal, and external carotid arteries are patent, with mild plaque at the bifurcation but no hemodynamically significant stenosis or occlusion. There is no dissection or aneurysm. Left carotid system: The left common, internal, and external carotid arteries are patent with no significant stenosis or occlusion. There is no dissection or aneurysm. Vertebral arteries: The vertebral arteries are patent, without hemodynamically significant stenosis or occlusion. There is no dissection or aneurysm. Skeleton: There is no acute osseous abnormality or suspicious osseous lesion. There is no visible canal hematoma. Postsurgical changes reflecting C5 through C7 ACDF are noted without evidence of complication.  Other neck: The soft tissues of the neck are unremarkable. Upper chest: The imaged lung apices are clear. Review of the MIP images confirms the above findings CTA HEAD FINDINGS Anterior circulation: The intracranial ICAs are patent. The bilateral MCAs are patent, without proximal stenosis or occlusion The bilateral ACAs are patent, without proximal stenosis or occlusion. The anterior communicating artery is normal There is no aneurysm or AVM. Posterior circulation: The bilateral V4 segments are patent. The basilar artery is patent. The major cerebellar arteries are patent. The bilateral PCAs are patent. There is multifocal moderate stenosis of the right P1 and P2 segments. There is no other proximal stenosis or occlusion.  Posterior communicating arteries are not definitely seen. There is no aneurysm or AVM. Venous sinuses: As permitted by contrast timing, patent. Anatomic variants: None. Review of the MIP images confirms the above findings IMPRESSION: 1. No acute intracranial pathology.  ASPECTS is 10. 2.  No emergent large vessel occlusion. 3. Multifocal moderate stenosis of the right PCA. Otherwise, no hemodynamically significant stenosis or occlusion in the head or neck. Findings communicated to Dr. Leonel Ramsay via Amion at 5 p.m. Electronically Signed   By: Valetta Mole M.D.   On: 08/06/2022 17:05   CT ABDOMEN PELVIS W CONTRAST  Result Date: 08/06/2022 CLINICAL DATA:  Left lower extremity pain and swelling, known left lower extremity DVT EXAM: CT ABDOMEN AND PELVIS WITH CONTRAST TECHNIQUE: Multidetector CT imaging of the abdomen and pelvis was performed using the standard protocol following bolus administration of intravenous contrast. RADIATION DOSE REDUCTION: This exam was performed according to the departmental dose-optimization program which includes automated exposure control, adjustment of the mA and/or kV according to patient size and/or use of iterative reconstruction technique. CONTRAST:  69m OMNIPAQUE IOHEXOL 350 MG/ML SOLN COMPARISON:  None Available. FINDINGS: Lower chest: Please see separately reported examination of the chest. Hepatobiliary: No solid liver abnormality is seen. Gallstone in the gallbladder fundus. No gallbladder wall thickening, or biliary dilatation. Pancreas: Unremarkable. No pancreatic ductal dilatation or surrounding inflammatory changes. Spleen: Normal in size without significant abnormality. Adrenals/Urinary Tract: Adrenal glands are unremarkable. Kidneys are normal, without renal calculi, solid lesion, or hydronephrosis. Bladder is unremarkable. Stomach/Bowel: Stomach is within normal limits. Appendix appears normal. No evidence of bowel wall thickening, distention, or inflammatory  changes. Moderate burden of stool and stool balls throughout the colon and rectum. Vascular/Lymphatic: Aortic atherosclerosis. Long segment, occlusive deep venous thrombosis involving the included left femoral veins, left internal, external, and common iliac veins, and the inferior confluence of the IVC (series 4, image 41). The right-sided pelvic veins appear patent. No enlarged abdominal or pelvic lymph nodes. Reproductive: Status post hysterectomy. Other: No abdominal wall hernia or abnormality. No ascites. Musculoskeletal: No acute or significant osseous findings. IMPRESSION: 1. Long segment, occlusive deep venous thrombosis involving the included left femoral veins, left internal, external, and common iliac veins, and the inferior confluence of the IVC. The right-sided pelvic veins appear patent. 2. Cholelithiasis. 3. Status post hysterectomy. Findings discussed by telephone with Dr. PSharlett Iles 2:20 p.m., 08/06/2022 Aortic Atherosclerosis (ICD10-I70.0). Electronically Signed   By: ADelanna AhmadiM.D.   On: 08/06/2022 14:30   CT Angio Chest PE W and/or Wo Contrast  Result Date: 08/06/2022 CLINICAL DATA:  PE suspected, known extensive deep venous thrombosis of the left lower extremity EXAM: CT ANGIOGRAPHY CHEST WITH CONTRAST TECHNIQUE: Multidetector CT imaging of the chest was performed using the standard protocol during bolus administration of intravenous contrast. Multiplanar CT image reconstructions and MIPs were obtained  to evaluate the vascular anatomy. RADIATION DOSE REDUCTION: This exam was performed according to the departmental dose-optimization program which includes automated exposure control, adjustment of the mA and/or kV according to patient size and/or use of iterative reconstruction technique. CONTRAST:  43m OMNIPAQUE IOHEXOL 350 MG/ML SOLN COMPARISON:  None Available. FINDINGS: Cardiovascular: Satisfactory opacification of the pulmonary arteries to the segmental level. No evidence of  pulmonary embolism. Heart at the upper limit of normal in size. Left coronary artery calcifications. Enlargement of the main pulmonary artery measuring up to 3.5 cm in caliber. No pericardial effusion. Scattered aortic atherosclerosis. Mediastinum/Nodes: No enlarged mediastinal, hilar, or axillary lymph nodes. Thyroid gland, trachea, and esophagus demonstrate no significant findings. Lungs/Pleura: Lungs are clear. No pleural effusion or pneumothorax. Upper Abdomen: Please see separately reported examination of the abdomen and pelvis. Musculoskeletal: No chest wall abnormality. No acute osseous findings. Review of the MIP images confirms the above findings. IMPRESSION: 1. Negative examination for pulmonary embolism. 2. Enlargement of the main pulmonary artery, as can be seen in pulmonary hypertension. 3. Coronary artery disease. Aortic Atherosclerosis (ICD10-I70.0). Electronically Signed   By: ADelanna AhmadiM.D.   On: 08/06/2022 14:22   [place]Venous Img Lower Unilateral Left  Result Date: 08/06/2022 CLINICAL DATA:  Left lower extremity pain and swelling. EXAM: Left LOWER EXTREMITY VENOUS DOPPLER ULTRASOUND TECHNIQUE: Gray-scale sonography with graded compression, as well as color Doppler and duplex ultrasound were performed to evaluate the lower extremity deep venous systems from the level of the common femoral vein and including the common femoral, femoral, profunda femoral, popliteal and calf veins including the posterior tibial, peroneal and gastrocnemius veins when visible. The superficial great saphenous vein was also interrogated. Spectral Doppler was utilized to evaluate flow at rest and with distal augmentation maneuvers in the common femoral, femoral and popliteal veins. COMPARISON:  None Available. FINDINGS: Contralateral Common Femoral Vein: Respiratory phasicity is normal and symmetric with the symptomatic side. No evidence of thrombus. Normal compressibility. Common Femoral Vein: Occlusive thrombus.  Saphenofemoral Junction: No evidence of thrombus. Normal compressibility and flow on color Doppler imaging. Profunda Femoral Vein: Occlusive thrombus. Femoral Vein: Occlusive thrombus. Popliteal Vein: Occlusive thrombus. Calf Veins: The posterior tibial vein is patent. No thrombus. The peroneal vein is not identified. Superficial Great Saphenous Vein: No evidence of thrombus. Normal compressibility. Venous Reflux:  None. Other Findings: Extensive subcutaneous edema in the left lower extremity. IMPRESSION: Extensive occlusive deep venous thrombosis in the left lower extremity as detailed above. These results will be called to the ordering clinician or representative by the Radiologist Assistant, and communication documented in the PACS or [employer] Electronically Signed   By: PMarijo SanesM.D.   On: 08/06/2022 13:33    Procedures Procedures  {Document cardiac monitor, telemetry assessment procedure when appropriate:1}  Medications Ordered in ED Medications  heparin ADULT infusion 100 units/mL (25000 units/2535m (1,100 Units/hr Intravenous New Bag/Given 08/06/22 1416)  memantine (NAMENDA) tablet 10 mg (has no administration in time range)  mirtazapine (REMERON) tablet 15 mg (has no administration in time range)  acetaminophen (TYLENOL) tablet 650 mg (has no administration in time range)    Or  acetaminophen (TYLENOL) suppository 650 mg (has no administration in time range)  ondansetron (ZOFRAN) tablet 4 mg (has no administration in time range)    Or  ondansetron (ZOFRAN) injection 4 mg (has no administration in time range)  hydrALAZINE (APRESOLINE) injection 5 mg (has no administration in time range)  sodium chloride flush (NS) 0.9 % injection 3 mL (has  no administration in time range)  heparin bolus via infusion 3,900 Units (3,900 Units Intravenous Bolus from Bag 08/06/22 1417)  iohexol (OMNIPAQUE) 350 MG/ML injection 75 mL (75 mLs Intravenous Contrast Given 08/06/22 1401)  lactated  ringers bolus 1,000 mL (1,000 mLs Intravenous New Bag/Given 08/06/22 1805)    ED Course/ Medical Decision Making/ A&P Clinical Course as of 08/06/22 1851  Sat Aug 06, 2022  1422 Dr Kathrynn Humble from [facility] for ED to ED transfer.  [RP]  44 Dr Trula Slade from vascular surgery. Recommends admission to hospital service with leg elevation and continuing heparin gtt. Will see when she gets to [facility]. Plans on intervening on Monday.  [RP]    Clinical Course User Index [RP] Fransico Meadow, MD                           Medical Decision Making Patient presents with known DVT.  On initial arrival, patient's critical care transport team was concerned for acute onset facial droop and confusion.  She did have what appeared to be a left visual field cut.  She is unsure if she has prior visual field deficits.  Code stroke activated due to these concerns.  Neurology arrived at bedside.  Code stroke imaging negative for acute intracranial pathology.  I discussed patient with neurology who noted that she had visual field cut in the left eye only not in right eye.  On chart review, patient facial droop appears baseline compared to her epic photo.  I updated the patient and her daughter with the findings on her code stroke imaging was negative.  I discussed patient with vascular surgery.  Recommended continuing heparin drip, admission, they will evaluate and patient will likely be a candidate for thrombectomy.  I discussed patient with the admitting hospitalist team.  Patient is admitted to their service.  Amount and/or Complexity of Data Reviewed Labs: ordered. Radiology: ordered and independent interpretation performed. Decision-making details documented in ED Course.  Risk Prescription drug management.     {Document critical care time when appropriate:1} {Document review of labs and clinical decision tools ie heart score, Chads2Vasc2 etc:1}  {Document your independent review of radiology  images, and any outside records:1} {Document your discussion with family members, caretakers, and with consultants:1} {Document social determinants of health affecting pt's care:1} {Document your decision making why or why not admission, treatments were needed:1} Final Clinical Impression(s) / ED Diagnoses Final diagnoses:  Acute deep vein thrombosis (DVT) of iliac vein of left lower extremity ([place])    Rx / DC Orders ED Discharge Orders          Ordered    Amb referral to [facility]        08/06/22 1837

## 2022-08-06 NOTE — ED Notes (Signed)
Pt has 2+ edema of left leg. Pt has 1+ left pedal pulse, cap refill greater than 3 sec, pt able to wiggle toes. Pt has diminished sensation of left leg.

## 2022-08-07 ENCOUNTER — Inpatient Hospital Stay (HOSPITAL_COMMUNITY): Payer: Medicare Other

## 2022-08-07 DIAGNOSIS — I4891 Unspecified atrial fibrillation: Secondary | ICD-10-CM

## 2022-08-07 DIAGNOSIS — I82412 Acute embolism and thrombosis of left femoral vein: Secondary | ICD-10-CM | POA: Diagnosis not present

## 2022-08-07 LAB — ECHOCARDIOGRAM COMPLETE
AR max vel: 2.32 cm2
AV Area VTI: 2.35 cm2
AV Area mean vel: 2.23 cm2
AV Mean grad: 4 mmHg
AV Peak grad: 8.3 mmHg
Ao pk vel: 1.44 m/s
Area-P 1/2: 3.12 cm2
Calc EF: 47.1 %
Height: 65 in
MV M vel: 2.65 m/s
MV Peak grad: 28 mmHg
S' Lateral: 2.8 cm
Single Plane A2C EF: 48.2 %
Single Plane A4C EF: 48 %
Weight: 2352 oz

## 2022-08-07 LAB — CBC
HCT: 35.8 % — ABNORMAL LOW (ref 36.0–46.0)
Hemoglobin: 11.6 g/dL — ABNORMAL LOW (ref 12.0–15.0)
MCH: 29 pg (ref 26.0–34.0)
MCHC: 32.4 g/dL (ref 30.0–36.0)
MCV: 89.5 fL (ref 80.0–100.0)
Platelets: 137 10*3/uL — ABNORMAL LOW (ref 150–400)
RBC: 4 MIL/uL (ref 3.87–5.11)
RDW: 12.3 % (ref 11.5–15.5)
WBC: 4.9 10*3/uL (ref 4.0–10.5)
nRBC: 0 % (ref 0.0–0.2)

## 2022-08-07 LAB — BASIC METABOLIC PANEL
Anion gap: 7 (ref 5–15)
BUN: 20 mg/dL (ref 8–23)
CO2: 22 mmol/L (ref 22–32)
Calcium: 8.4 mg/dL — ABNORMAL LOW (ref 8.9–10.3)
Chloride: 112 mmol/L — ABNORMAL HIGH (ref 98–111)
Creatinine, Ser: 1.23 mg/dL — ABNORMAL HIGH (ref 0.44–1.00)
GFR, Estimated: 44 mL/min — ABNORMAL LOW (ref >60)
Glucose, Bld: 87 mg/dL (ref 70–99)
Potassium: 3.3 mmol/L — ABNORMAL LOW (ref 3.5–5.1)
Sodium: 141 mmol/L (ref 135–145)

## 2022-08-07 LAB — HEPARIN LEVEL (UNFRACTIONATED)
Heparin Unfractionated: 0.74 IU/mL — ABNORMAL HIGH (ref 0.30–0.70)
Heparin Unfractionated: 0.51 IU/mL (ref 0.30–0.70)

## 2022-08-07 MED ORDER — POTASSIUM CHLORIDE 20 MEQ PO PACK
40.0000 meq | PACK | Freq: Two times a day (BID) | ORAL | Status: AC
  Administered 2022-08-07 (×2): 40 meq via ORAL
  Filled 2022-08-07 (×2): qty 2

## 2022-08-07 MED ORDER — SENNOSIDES-DOCUSATE SODIUM 8.6-50 MG PO TABS
1.0000 | ORAL_TABLET | Freq: Two times a day (BID) | ORAL | Status: DC
  Administered 2022-08-07 – 2022-08-08 (×3): 1 via ORAL
  Filled 2022-08-07 (×5): qty 1

## 2022-08-07 MED ORDER — LACTATED RINGERS IV SOLN: INTRAVENOUS | Status: AC

## 2022-08-07 MED ORDER — EZETIMIBE 10 MG PO TABS
10.0000 mg | ORAL_TABLET | Freq: Every day | ORAL | Status: DC
  Administered 2022-08-07 – 2022-08-09 (×3): 10 mg via ORAL
  Filled 2022-08-07 (×3): qty 1

## 2022-08-07 NOTE — Plan of Care (Signed)
  Problem: Education: Goal: Knowledge of disease or condition will improve 08/07/2022 2147 by Morene Rankins, LPN Outcome: Progressing 08/07/2022 2147 by Morene Rankins, LPN Outcome: Progressing Goal: Understanding of medication regimen will improve 08/07/2022 2147 by Morene Rankins, LPN Outcome: Progressing 08/07/2022 2147 by Morene Rankins, LPN Outcome: Progressing Goal: Individualized Educational Video(s) 08/07/2022 2147 by Morene Rankins, LPN Outcome: Progressing 08/07/2022 2147 by Morene Rankins, LPN Outcome: Progressing   Problem: Activity: Goal: Ability to tolerate increased activity will improve 08/07/2022 2147 by Morene Rankins, LPN Outcome: Progressing 08/07/2022 2147 by Morene Rankins, LPN Outcome: Progressing   Problem: Cardiac: Goal: Ability to achieve and maintain adequate cardiopulmonary perfusion will improve 08/07/2022 2147 by Morene Rankins, LPN Outcome: Progressing 08/07/2022 2147 by Morene Rankins, LPN Outcome: Progressing   Problem: Health Behavior/Discharge Planning: Goal: Ability to safely manage health-related needs after discharge will improve 08/07/2022 2147 by Morene Rankins, LPN Outcome: Progressing 08/07/2022 2147 by Morene Rankins, LPN Outcome: Progressing   Problem: Education: Goal: Knowledge of General Education information will improve Description: Including pain rating scale, medication(s)/side effects and non-pharmacologic comfort measures 08/07/2022 2147 by Morene Rankins, LPN Outcome: Progressing 08/07/2022 2147 by Morene Rankins, LPN Outcome: Progressing   Problem: Health Behavior/Discharge Planning: Goal: Ability to manage health-related needs will improve 08/07/2022 2147 by Morene Rankins, LPN Outcome: Progressing 08/07/2022 2147 by Morene Rankins, LPN Outcome: Progressing   Problem: Clinical Measurements: Goal: Ability to maintain clinical measurements within normal limits will  improve 08/07/2022 2147 by Morene Rankins, LPN Outcome: Progressing 08/07/2022 2147 by Morene Rankins, LPN Outcome: Progressing Goal: Will remain free from infection 08/07/2022 2147 by Morene Rankins, LPN Outcome: Progressing 08/07/2022 2147 by Morene Rankins, LPN Outcome: Progressing Goal: Diagnostic test results will improve Outcome: Progressing Goal: Respiratory complications will improve Outcome: Progressing Goal: Cardiovascular complication will be avoided Outcome: Progressing   Problem: Activity: Goal: Risk for activity intolerance will decrease Outcome: Progressing   Problem: Nutrition: Goal: Adequate nutrition will be maintained Outcome: Progressing   Problem: Coping: Goal: Level of anxiety will decrease Outcome: Progressing   Problem: Elimination: Goal: Will not experience complications related to bowel motility Outcome: Progressing Goal: Will not experience complications related to urinary retention Outcome: Progressing   Problem: Pain Managment: Goal: General experience of comfort will improve Outcome: Progressing   Problem: Safety: Goal: Ability to remain free from injury will improve Outcome: Progressing   Problem: Skin Integrity: Goal: Risk for impaired skin integrity will decrease Outcome: Progressing

## 2022-08-07 NOTE — Progress Notes (Addendum)
PROGRESS NOTE    Ashley Black  YWV:[id number] DOB: [birth year] DOA: 08/06/2022 PCP: Harlan Stains, MD     Brief Narrative:  H/o bradycardia, dementia, and HLD presenting with LLE edema    Subjective:  She denies pain, she is eating lunch, she is slightly confused about the month, states it is October, she understands that she has clots in left leg and she is going to have procedure done to remove it  Assessment & Plan:  Principal Problem:   DVT (deep venous thrombosis) ([place]) Active Problems:   Dyslipidemia   New onset atrial fibrillation ([place])   Dementia without behavioral disturbance (HCC)   Mouth droop due to facial weakness    Assessment and Plan:   Acute LLE DVT, extensive  -appears  unprovoked, recommend hematology follow up after discharge -Continue heparin drip, seen by vascular surgery, plan for thrombectomy on Monday -Management per vascular surgery, input appreciated    New diagnosis of afib with known h/o bradycardia Heparin drip  Hypokalemia, replaced, repeat in am, check mag  AKI Will check ua Start on gentle hydration with LR at 75cc for 12hrs,  Repeat bmp in am, check mag   Left facial droop -Appears to be chronic -Neurology consulted and signed off after negative CT/CTA  HLD Resume home meds zetia    Mild dementia  she is slightly confused about the month, states it is October, she understands that she has clots in left leg and she is going to have procedure done to remove it .   I have Reviewed nursing notes, Vitals, pain scores, I/o's, Lab results and  imaging results since pt's last encounter, details please see discussion above  I ordered the following labs:  Unresulted Labs (From admission, onward)     Start     Ordered   08/08/22 0500  Heparin level (unfractionated)  Daily at 5am,   R      08/06/22 2235   08/08/22 8546  Basic metabolic panel  Tomorrow morning,   R        08/07/22 1319   08/08/22 0500  Magnesium  Tomorrow  morning,   R        08/07/22 1319   08/07/22 1600  Heparin level (unfractionated)  Once-Timed,   TIMED        08/07/22 0720   08/07/22 1436  Urinalysis, Routine w reflex microscopic Urine, Clean Catch  Once,   R        08/07/22 1435   08/07/22 0500  CBC  Daily at 5am,   R      08/06/22 1816             DVT prophylaxis: Currently on heparin drip   Code Status:   Code Status: Full Code  Family Communication: None at bedside Disposition:   Dispo: The patient is from: Home, lives with husband              Anticipated d/c is to: TBD, will have PT see her after thrombectomy               Anticipated d/c date is: TBD  Antimicrobials:    Anti-infectives (From admission, onward)    None          Objective: Vitals:   08/07/22 0630 08/07/22 0900 08/07/22 1004 08/07/22 1300  BP: 120/62 (!) 101/56  (!) 101/90  Pulse: (!) 49 (!) 49  (!) 105  Resp: '15 17  16  '$ Temp:   98.6 F (37  C)   TempSrc:      SpO2: 99% 95%  99%  Weight:      Height:       No intake or output data in the 24 hours ending 08/07/22 1436 Filed Weights   08/06/22 1156  Weight: 66.7 kg    Examination:  General exam: alert, awake, communicative,calm, NAD Respiratory system: Clear to auscultation. Respiratory effort normal. Cardiovascular system:  RRR.  Gastrointestinal system: Abdomen is nondistended, soft and nontender.  Normal bowel sounds heard. Central nervous system: Alert and oriented ( slightly confused about the month Extremities:  left leg edema Skin: No rashes, lesions or ulcers Psychiatry: Calm and cooperative    Data Reviewed: I have personally reviewed  labs and visualized  imaging studies since the last encounter and formulate the plan        Scheduled Meds:  ezetimibe  10 mg Oral Daily   memantine  10 mg Oral BID   mirtazapine  15 mg Oral QHS   potassium chloride  40 mEq Oral BID   senna-docusate  1 tablet Oral BID   sodium chloride flush  3 mL Intravenous Q12H    Continuous Infusions:  heparin 850 Units/hr (08/07/22 0723)   lactated ringers       LOS: 1 day     Florencia Reasons, MD PhD FACP [facility]  Available via Epic secure chat 7am-7pm for nonurgent issues Please page for urgent issues To page the attending provider between 7A-7P or the covering provider during after hours 7P-7A, please log into the web site [email or web address] and access using universal [place] password for that web site. If you do not have the password, please call the hospital operator.    08/07/2022, 2:36 PM

## 2022-08-07 NOTE — Progress Notes (Signed)
ANTICOAGULATION CONSULT NOTE  Pharmacy Consult for heparin Indication: pulmonary embolus  No Known Allergies  Patient Measurements: Height: '5\' 5"'$  (165.1 cm) Weight: 66.7 kg (147 lb) IBW/kg (Calculated) : 57 Heparin Dosing Weight: 66.7 kg  Vital Signs: Temp: 98.6 F (37 C) (11/26 1004) BP: 101/90 (11/26 1300) Pulse Rate: 105 (11/26 1300)  Labs: Recent Labs    08/06/22 1257 08/06/22 1400 08/06/22 2152 08/07/22 0434 08/07/22 0630 08/07/22 1704  HGB 13.5  --   --  11.6*  --   --   HCT 40.6  --   --  35.8*  --   --   PLT 168  --   --  137*  --   --   APTT 24  --   --   --   --   --   LABPROT 15.1  --   --   --   --   --   INR 1.2  --   --   --   --   --   HEPARINUNFRC  --   --  0.73*  --  0.74* 0.51  CREATININE 1.39*  --   --  1.23*  --   --   TROPONINIHS  --  5  --   --   --   --      Estimated Creatinine Clearance: 31.7 mL/min (A) (by C-G formula based on SCr of 1.23 mg/dL (H)).   Medical History: Past Medical History:  Diagnosis Date   Bradycardia    testing done 2012 no cause found   Dizziness    brief occasions   Dyslipidemia    Edema of both legs    very rare   History of hysterectomy 1985   Pneumonia    few times, none recent    Assessment: 81 yo F presenting with SHOB and lef leg swelling. Pt found to have extensive occlusive DVT in L lower extremity on doppler. CT PE negative. Pharmacy consulted to manage heparin infusion in setting of VTE. No anticoagulants in fill hx  Heparin level came back therapeutic at 0.51, on 850 units/hr. Hgb 11.6. Plts 137. No s/sx of bleeding or infusion issues noted.  Goal of Therapy:  Heparin level 0.3-0.7 units/ml Monitor platelets by anticoagulation protocol: Yes   Plan:  Continue heparin infusion at 850 units/hr Monitor heparin level, CBC and s/s of bleeding daily   Antonietta Jewel, PharmD, [place] Clinical Pharmacist  Phone: [phone] 08/07/2022 6:07 PM  Please check AMION for all [place] phone  numbers After 10:00 PM, call [place] [phone]

## 2022-08-07 NOTE — ED Notes (Signed)
Patient left the floor in stable condition, no s/s of distress, with her belongings and staff.

## 2022-08-07 NOTE — Progress Notes (Signed)
  Echocardiogram 2D Echocardiogram has been performed.  Ashley Black 08/07/2022, 4:28 PM

## 2022-08-07 NOTE — Progress Notes (Signed)
    Subjective  -   No acute overnight events   Physical Exam:  Left leg remains edematous No signs of ischemic changes       Assessment/Plan:    Left leg DVT: This appears to be a unprovoked process.  We discussed proceeding with venous mechanical thrombectomy of the left leg tomorrow.  She will be n.p.o. after 9 AM as this will likely be in the afternoon.  She should continue on her heparin.  This should not be discontinued prior to her procedure.  She will likely need hematology work-up in the future.  Wells Ming Kunka 08/07/2022 12:39 PM --  Vitals:   08/07/22 0900 08/07/22 1004  BP: (!) 101/56   Pulse: (!) 49   Resp: 17   Temp:  98.6 F (37 C)  SpO2: 95%    No intake or output data in the 24 hours ending 08/07/22 1239   Laboratory CBC    Component Value Date/Time   WBC 4.9 08/07/2022 0434   HGB 11.6 (L) 08/07/2022 0434   HCT 35.8 (L) 08/07/2022 0434   PLT 137 (L) 08/07/2022 0434    BMET    Component Value Date/Time   NA 141 08/07/2022 0434   K 3.3 (L) 08/07/2022 0434   CL 112 (H) 08/07/2022 0434   CO2 22 08/07/2022 0434   GLUCOSE 87 08/07/2022 0434   BUN 20 08/07/2022 0434   CREATININE 1.23 (H) 08/07/2022 0434   CALCIUM 8.4 (L) 08/07/2022 0434   GFRNONAA 44 (L) 08/07/2022 0434   GFRAA >60 04/25/2020 0905    COAG Lab Results  Component Value Date   INR 1.2 08/06/2022   No results found for: "PTT"  Antibiotics Anti-infectives (From admission, onward)    None        V. Leia Alf, M.D., [facility] [place] Office: [phone] Pager:  [phone]

## 2022-08-07 NOTE — Progress Notes (Signed)
ANTICOAGULATION CONSULT NOTE  Pharmacy Consult for heparin Indication: pulmonary embolus  No Known Allergies  Patient Measurements: Height: '5\' 5"'$  (165.1 cm) Weight: 66.7 kg (147 lb) IBW/kg (Calculated) : 57 Heparin Dosing Weight: 66.7 kg  Vital Signs: Temp: 97.6 F (36.4 C) (11/26 0436) Temp Source: Oral (11/26 0436) BP: 120/62 (11/26 0630) Pulse Rate: 49 (11/26 0630)  Labs: Recent Labs    08/06/22 1257 08/06/22 1400 08/06/22 2152 08/07/22 0434 08/07/22 0630  HGB 13.5  --   --  11.6*  --   HCT 40.6  --   --  35.8*  --   PLT 168  --   --  137*  --   APTT 24  --   --   --   --   LABPROT 15.1  --   --   --   --   INR 1.2  --   --   --   --   HEPARINUNFRC  --   --  0.73*  --  0.74*  CREATININE 1.39*  --   --  1.23*  --   TROPONINIHS  --  5  --   --   --      Estimated Creatinine Clearance: 31.7 mL/min (A) (by C-G formula based on SCr of 1.23 mg/dL (H)).   Medical History: Past Medical History:  Diagnosis Date   Bradycardia    testing done 2012 no cause found   Dizziness    brief occasions   Dyslipidemia    Edema of both legs    very rare   History of hysterectomy 1985   Pneumonia    few times, none recent    Assessment: 81 yo F presenting with SHOB and lef leg swelling. Pt found to have extensive occlusive DVT in L lower extremity on doppler. CT PE negative. Pharmacy consulted to manage heparin infusion in setting of VTE. No anticoagulants in fill hx  Heparin level came back supratherapeutic at 0.74 on reduced rate of heparin 1000 units/hr. Level drawn appropriately per RN. Hgb 11.6. Plts 137.   Goal of Therapy:  Heparin level 0.3-0.7 units/ml Monitor platelets by anticoagulation protocol: Yes   Plan:  Reduce heparin infusion to 850 units/hr Check 8 hr heparin level Monitor heparin level, CBC and s/s of bleeding daily   Cristela Felt, PharmD, BCPS Clinical Pharmacist 08/07/2022 7:15 AM

## 2022-08-08 ENCOUNTER — Other Ambulatory Visit (HOSPITAL_COMMUNITY): Payer: Self-pay

## 2022-08-08 ENCOUNTER — Encounter (HOSPITAL_COMMUNITY): Payer: Self-pay | Admitting: Vascular Surgery

## 2022-08-08 ENCOUNTER — Encounter (HOSPITAL_COMMUNITY)
Admission: EM | Disposition: A | Discharge status: Home / Self Care | Payer: Self-pay | Source: Home / Self Care | Attending: Internal Medicine

## 2022-08-08 DIAGNOSIS — I824Y2 Acute embolism and thrombosis of unspecified deep veins of left proximal lower extremity: Secondary | ICD-10-CM

## 2022-08-08 HISTORY — PX: INTRAVASCULAR ULTRASOUND/IVUS: CATH118244

## 2022-08-08 HISTORY — PX: LOWER EXTREMITY VENOGRAPHY: CATH118253

## 2022-08-08 HISTORY — PX: PERIPHERAL VASCULAR BALLOON ANGIOPLASTY: CATH118281

## 2022-08-08 HISTORY — PX: PERIPHERAL VASCULAR THROMBECTOMY: CATH118306

## 2022-08-08 HISTORY — PX: IVC VENOGRAPHY: CATH118301

## 2022-08-08 LAB — POCT I-STAT, CHEM 8
BUN: 13 mg/dL (ref 8–23)
Calcium, Ion: 0.91 mmol/L — ABNORMAL LOW (ref 1.15–1.40)
Chloride: 113 mmol/L — ABNORMAL HIGH (ref 98–111)
Creatinine, Ser: 0.8 mg/dL (ref 0.44–1.00)
Glucose, Bld: 72 mg/dL (ref 70–99)
HCT: 26 % — ABNORMAL LOW (ref 36.0–46.0)
Hemoglobin: 8.8 g/dL — ABNORMAL LOW (ref 12.0–15.0)
Potassium: 3.4 mmol/L — ABNORMAL LOW (ref 3.5–5.1)
Sodium: 150 mmol/L — ABNORMAL HIGH (ref 135–145)
TCO2: 19 mmol/L — ABNORMAL LOW (ref 22–32)

## 2022-08-08 LAB — CBC
HCT: 36.1 % (ref 36.0–46.0)
Hemoglobin: 12.3 g/dL (ref 12.0–15.0)
MCH: 29.9 pg (ref 26.0–34.0)
MCHC: 34.1 g/dL (ref 30.0–36.0)
MCV: 87.8 fL (ref 80.0–100.0)
Platelets: 141 10*3/uL — ABNORMAL LOW (ref 150–400)
RBC: 4.11 MIL/uL (ref 3.87–5.11)
RDW: 12.4 % (ref 11.5–15.5)
WBC: 5.2 10*3/uL (ref 4.0–10.5)
nRBC: 0 % (ref 0.0–0.2)

## 2022-08-08 LAB — BASIC METABOLIC PANEL
Anion gap: 7 (ref 5–15)
BUN: 20 mg/dL (ref 8–23)
CO2: 21 mmol/L — ABNORMAL LOW (ref 22–32)
Calcium: 8.6 mg/dL — ABNORMAL LOW (ref 8.9–10.3)
Chloride: 111 mmol/L (ref 98–111)
Creatinine, Ser: 1.16 mg/dL — ABNORMAL HIGH (ref 0.44–1.00)
GFR, Estimated: 47 mL/min — ABNORMAL LOW (ref >60)
Glucose, Bld: 101 mg/dL — ABNORMAL HIGH (ref 70–99)
Potassium: 4.3 mmol/L (ref 3.5–5.1)
Sodium: 139 mmol/L (ref 135–145)

## 2022-08-08 LAB — HEPARIN LEVEL (UNFRACTIONATED): Heparin Unfractionated: 0.61 IU/mL (ref 0.30–0.70)

## 2022-08-08 LAB — MAGNESIUM: Magnesium: 1.9 mg/dL (ref 1.7–2.4)

## 2022-08-08 SURGERY — PERIPHERAL VASCULAR THROMBECTOMY
Anesthesia: LOCAL | Laterality: Left

## 2022-08-08 MED ORDER — OXYCODONE HCL 5 MG PO TABS
5.0000 mg | ORAL_TABLET | ORAL | Status: DC | PRN
  Filled 2022-08-08: qty 1

## 2022-08-08 MED ORDER — HEPARIN (PORCINE) IN NACL 1000-0.9 UT/500ML-% IV SOLN
INTRAVENOUS | Status: AC
  Filled 2022-08-08: qty 500

## 2022-08-08 MED ORDER — SODIUM CHLORIDE 0.9 % IV SOLN: INTRAVENOUS | Status: AC

## 2022-08-08 MED ORDER — HYDRALAZINE HCL 20 MG/ML IJ SOLN: 5.0000 mg | INTRAMUSCULAR | Status: DC | PRN

## 2022-08-08 MED ORDER — ONDANSETRON HCL 4 MG/2ML IJ SOLN: 4.0000 mg | Freq: Four times a day (QID) | INTRAMUSCULAR | Status: DC | PRN

## 2022-08-08 MED ORDER — MIDAZOLAM HCL 2 MG/2ML IJ SOLN
INTRAMUSCULAR | Status: AC
  Filled 2022-08-08: qty 2

## 2022-08-08 MED ORDER — MIDAZOLAM HCL 2 MG/2ML IJ SOLN
INTRAMUSCULAR | Status: DC | PRN
  Administered 2022-08-08: 1 mg via INTRAVENOUS

## 2022-08-08 MED ORDER — HEPARIN SODIUM (PORCINE) 1000 UNIT/ML IJ SOLN
INTRAMUSCULAR | Status: DC | PRN
  Administered 2022-08-08: 7000 [IU] via INTRAVENOUS

## 2022-08-08 MED ORDER — ACETAMINOPHEN 325 MG PO TABS: 650.0000 mg | ORAL_TABLET | ORAL | Status: DC | PRN

## 2022-08-08 MED ORDER — IODIXANOL 320 MG/ML IV SOLN
INTRAVENOUS | Status: DC | PRN
  Administered 2022-08-08: 50 mL

## 2022-08-08 MED ORDER — LIDOCAINE HCL (PF) 1 % IJ SOLN
INTRAMUSCULAR | Status: AC
  Filled 2022-08-08: qty 30

## 2022-08-08 MED ORDER — HEPARIN (PORCINE) 25000 UT/250ML-% IV SOLN
750.0000 [IU]/h | INTRAVENOUS | Status: DC
  Administered 2022-08-08 – 2022-08-09 (×2): 850 [IU]/h via INTRAVENOUS
  Filled 2022-08-08: qty 250

## 2022-08-08 MED ORDER — LIDOCAINE HCL (PF) 1 % IJ SOLN
INTRAMUSCULAR | Status: DC | PRN
  Administered 2022-08-08: 10 mL

## 2022-08-08 MED ORDER — SODIUM CHLORIDE 0.9% FLUSH: 3.0000 mL | Freq: Two times a day (BID) | INTRAVENOUS | Status: DC

## 2022-08-08 MED ORDER — HEPARIN SODIUM (PORCINE) 1000 UNIT/ML IJ SOLN
INTRAMUSCULAR | Status: AC
  Filled 2022-08-08: qty 10

## 2022-08-08 MED ORDER — FENTANYL CITRATE (PF) 100 MCG/2ML IJ SOLN
INTRAMUSCULAR | Status: AC
  Filled 2022-08-08: qty 2

## 2022-08-08 MED ORDER — FENTANYL CITRATE (PF) 100 MCG/2ML IJ SOLN
INTRAMUSCULAR | Status: DC | PRN
  Administered 2022-08-08: 25 ug via INTRAVENOUS
  Administered 2022-08-08: 50 ug via INTRAVENOUS

## 2022-08-08 MED ORDER — SODIUM CHLORIDE 0.9 % IV SOLN: 250.0000 mL | INTRAVENOUS | Status: DC | PRN

## 2022-08-08 MED ORDER — LABETALOL HCL 5 MG/ML IV SOLN: 10.0000 mg | INTRAVENOUS | Status: DC | PRN

## 2022-08-08 MED ORDER — HEPARIN (PORCINE) IN NACL 1000-0.9 UT/500ML-% IV SOLN
INTRAVENOUS | Status: DC | PRN
  Administered 2022-08-08: 500 mL

## 2022-08-08 MED ORDER — SODIUM CHLORIDE 0.9% FLUSH: 3.0000 mL | INTRAVENOUS | Status: DC | PRN

## 2022-08-08 MED ORDER — ASPIRIN 81 MG PO TBEC
81.0000 mg | DELAYED_RELEASE_TABLET | Freq: Every day | ORAL | Status: DC
  Administered 2022-08-08 – 2022-08-09 (×2): 81 mg via ORAL
  Filled 2022-08-08 (×2): qty 1

## 2022-08-08 MED ORDER — SODIUM CHLORIDE 0.9 % IV SOLN: INTRAVENOUS | Status: DC

## 2022-08-08 SURGICAL SUPPLY — 19 items
BALLN MUSTANG 10X60X135 (BALLOONS) ×1
BALLN MUSTANG 12X60X135 (BALLOONS) ×1
BALLOON MUSTANG 10X60X135 (BALLOONS) IMPLANT
BALLOON MUSTANG 12X60X135 (BALLOONS) IMPLANT
CANISTER PENUMBRA ENGINE (MISCELLANEOUS) IMPLANT
CATH LIGHTNI FLASH 16XTORQ 100 (CATHETERS) IMPLANT
CATH LIGHTNING FLASH XTORQ 100 (CATHETERS) ×1
CATH VISIONS PV .035 IVUS (CATHETERS) IMPLANT
DRYSEAL FLEXSHEATH 16FR 33CM (SHEATH) ×1
GLIDEWIRE ADV .035X260CM (WIRE) IMPLANT
GLIDEWIRE NITREX 0.018X80X5 (WIRE) ×1
GUIDEWIRE NITREX 0.018X80X5 (WIRE) IMPLANT
KIT ENCORE 26 ADVANTAGE (KITS) IMPLANT
KIT MICROPUNCTURE NIT STIFF (SHEATH) IMPLANT
PROTECTION STATION PRESSURIZED (MISCELLANEOUS) ×1
SHEATH DRYSEAL FLEX 16FR 33CM (SHEATH) IMPLANT
SHEATH PINNACLE 8F 10CM (SHEATH) IMPLANT
SHEATH PROBE COVER 6X72 (BAG) IMPLANT
STATION PROTECTION PRESSURIZED (MISCELLANEOUS) IMPLANT

## 2022-08-08 NOTE — Progress Notes (Signed)
ANTICOAGULATION CONSULT NOTE - Follow-Up  Pharmacy Consult for heparin Indication: LLE DVT  No Known Allergies  Patient Measurements: Height: '5\' 5"'$  (165.1 cm) Weight: 66.7 kg (147 lb 0.8 oz) IBW/kg (Calculated) : 57 Heparin Dosing Weight: 66.7 kg  Vital Signs: Temp: 97.7 F (36.5 C) (11/27 0825) Temp Source: Oral (11/27 0825) BP: 122/66 (11/27 0825) Pulse Rate: 41 (11/27 0830)  Labs: Recent Labs    08/06/22 1257 08/06/22 1400 08/06/22 2152 08/07/22 0434 08/07/22 0630 08/07/22 1704 08/08/22 0339  HGB 13.5  --   --  11.6*  --   --  12.3  HCT 40.6  --   --  35.8*  --   --  36.1  PLT 168  --   --  137*  --   --  141*  APTT 24  --   --   --   --   --   --   LABPROT 15.1  --   --   --   --   --   --   INR 1.2  --   --   --   --   --   --   HEPARINUNFRC  --   --    < >  --  0.74* 0.51 0.61  CREATININE 1.39*  --   --  1.23*  --   --  1.16*  TROPONINIHS  --  5  --   --   --   --   --    < > = values in this interval not displayed.     Estimated Creatinine Clearance: 33.6 mL/min (A) (by C-G formula based on SCr of 1.16 mg/dL (H)).   Medical History: Past Medical History:  Diagnosis Date   Bradycardia    testing done 2012 no cause found   Dizziness    brief occasions   Dyslipidemia    Edema of both legs    very rare   History of hysterectomy 1985   Pneumonia    few times, none recent    Assessment: 81 yo F presenting with SHOB and lef leg swelling. Pt found to have extensive occlusive DVT in L lower extremity on doppler. CT PE negative. Pharmacy consulted to manage heparin infusion in setting of VTE. No anticoagulants in fill hx.  Heparin level remains therapeutic on heparin at 850 units/hr. Hgb 12.3. Plts 141. No s/sx of bleeding or infusion issues noted.  Noted plans for thrombectomy this afternoon.  Goal of Therapy:  Heparin level 0.3-0.7 units/ml Monitor platelets by anticoagulation protocol: Yes   Plan:  Continue heparin infusion at 850  units/hr Monitor heparin level, CBC and s/s of bleeding daily  Follow-up plans for oral anticoagulation post procedure  Horton Chin, Pharm.D., BCPS Clinical Pharmacist Clinical phone for 08/08/2022 from 7:30-3:00 is [phone].  **Pharmacist phone directory can be found on [email or web address] listed under [place].  08/08/2022 10:24 AM

## 2022-08-08 NOTE — TOC Benefit Eligibility Note (Signed)
Patient Teacher, English as a foreign language completed.    The patient is currently admitted and upon discharge could be taking Eliquis 5 mg.  The current 30 day co-pay is $47.00.   The patient is currently admitted and upon discharge could be taking Xarelto 20 mg.  The current 30 day co-pay is $47.00.   The patient is insured through [place], [place] Patient Advocate Specialist [place] Patient Advocate Team Direct Number: [phone]  Fax: [phone]

## 2022-08-08 NOTE — H&P (View-Only) (Signed)
    Subjective  -   No acutre events overnight Her leg fels a little better   Physical Exam:  Bradycardic Left leg fullness, essentially unchanged       Assessment/Plan:    Left leg DVT:  Plan for thrombectomy this afternoon.  Will need heme-onc outpatient referral for unprovoked DVT and length of anticoagulation  Ashley Black 08/08/2022 7:58 AM --  Vitals:   08/07/22 1944 08/08/22 0438  BP: (!) 148/71 135/72  Pulse: (!) 55 (!) 53  Resp: 15 17  Temp: 98.1 F (36.7 C) 98.3 F (36.8 C)  SpO2: 100% 99%    Intake/Output Summary (Last 24 hours) at 08/08/2022 0758 Last data filed at 08/07/2022 1814 Gross per 24 hour  Intake --  Output 600 ml  Net -600 ml     Laboratory CBC    Component Value Date/Time   WBC 5.2 08/08/2022 0339   HGB 12.3 08/08/2022 0339   HCT 36.1 08/08/2022 0339   PLT 141 (L) 08/08/2022 0339    BMET    Component Value Date/Time   NA 139 08/08/2022 0339   K 4.3 08/08/2022 0339   CL 111 08/08/2022 0339   CO2 21 (L) 08/08/2022 0339   GLUCOSE 101 (H) 08/08/2022 0339   BUN 20 08/08/2022 0339   CREATININE 1.16 (H) 08/08/2022 0339   CALCIUM 8.6 (L) 08/08/2022 0339   GFRNONAA 47 (L) 08/08/2022 0339   GFRAA >60 04/25/2020 0905    COAG Lab Results  Component Value Date   INR 1.2 08/06/2022   No results found for: "PTT"  Antibiotics Anti-infectives (From admission, onward)    None        V. Leia Alf, M.D., [facility] [place] Office: [phone] Pager:  [phone]

## 2022-08-08 NOTE — Progress Notes (Signed)
ANTICOAGULATION CONSULT NOTE - Follow-Up  Pharmacy Consult for heparin Indication: LLE DVT  No Known Allergies  Patient Measurements: Height: '5\' 5"'$  (165.1 cm) Weight: 66.7 kg (147 lb 0.8 oz) IBW/kg (Calculated) : 57 Heparin Dosing Weight: 66.7 kg  Vital Signs: Temp: 97.5 F (36.4 C) (11/27 1445) Temp Source: Oral (11/27 1445) BP: 117/59 (11/27 1445) Pulse Rate: 42 (11/27 1445)  Labs: Recent Labs    08/06/22 1257 08/06/22 1400 08/06/22 2152 08/07/22 0434 08/07/22 0630 08/07/22 1704 08/08/22 0339 08/08/22 1358  HGB 13.5  --   --  11.6*  --   --  12.3 8.8*  HCT 40.6  --   --  35.8*  --   --  36.1 26.0*  PLT 168  --   --  137*  --   --  141*  --   APTT 24  --   --   --   --   --   --   --   LABPROT 15.1  --   --   --   --   --   --   --   INR 1.2  --   --   --   --   --   --   --   HEPARINUNFRC  --   --    < >  --  0.74* 0.51 0.61  --   CREATININE 1.39*  --   --  1.23*  --   --  1.16* 0.80  TROPONINIHS  --  5  --   --   --   --   --   --    < > = values in this interval not displayed.     Estimated Creatinine Clearance: 48.8 mL/min (by C-G formula based on SCr of 0.8 mg/dL).   Medical History: Past Medical History:  Diagnosis Date   Bradycardia    testing done 2012 no cause found   Dizziness    brief occasions   Dyslipidemia    Edema of both legs    very rare   History of hysterectomy 1985   Pneumonia    few times, none recent    Assessment: 81 yo F presenting with SHOB and lef leg swelling. Pt found to have extensive occlusive DVT in L lower extremity on doppler. CT PE negative. Pharmacy consulted to manage heparin infusion in setting of VTE. No anticoagulants in fill hx.  Heparin level remains therapeutic on heparin at 850 units/hr. Hgb 12.3. Plts 141. No s/sx of bleeding or infusion issues noted.    Patient s/p thrombectomy 11/27.  To restart heparin at previously therapeutic rate.  Goal of Therapy:  Heparin level 0.3-0.7 units/ml Monitor  platelets by anticoagulation protocol: Yes   Plan:  Restart heparin infusion at 850 units/hr Monitor heparin level, CBC and s/s of bleeding daily  Follow-up plans for oral anticoagulation post procedure  Horton Chin, Pharm.D., BCPS Clinical Pharmacist Clinical phone for 08/08/2022 from 7:30-3:00 is [phone].  **Pharmacist phone directory can be found on [email or web address] listed under [place].  08/08/2022 3:18 PM

## 2022-08-08 NOTE — Op Note (Signed)
Patient name: Ashley Black MRN: [id number] DOB: Aug 10, 1941 Sex: female  08/08/2022 Pre-operative Diagnosis: Extensive left lower extremity DVT Post-operative diagnosis:  Same Surgeon:  Erlene Quan C. Donzetta Matters, MD Procedure Performed: 1.  Ultrasound-guided cannulation left popliteal vein 2.  Interventional ultrasound left femoral vein, left common femoral vein, left external and common iliac veins and IVC 3.  Percutaneous mechanical thrombectomy with penumbra CAT 16 of left common femoral, left external and common iliac veins and IVC 4.  Balloon angioplasty with 10 and 12 mm plain balloons of left external and common iliac veins 5.  Moderate sedation with fentanyl and Versed for 51 minutes 6.  Left lower extremity and central venogram including femoral, common femoral, external and common iliac veins and IVC   Indications: 81 year old female without significant history of DVT now presents with extensive swelling of the left lower extremity with evidence of DVT on ultrasound and central occlusion of her left external and common iliac veins and clot extending into the IVC.  She is now indicated for left lower extremity venogram with intravascular ultrasound and possible thrombectomy and possible stenting.  Findings: Clot extended from the common femoral vein up through the confluence of the IVC and this was removed using percutaneous mechanical thrombectomy which was mostly acute but also had a chronic component as well.  There appeared to be a stenosis at the confluence of the hypogastric vein on the left this was ballooned with 10 and 12 mm balloons there was no evidence of May Thurner syndrome and at completion there was brisk flow through the external and common iliac veins into the IVC and no stent was placed.   Procedure:  The patient was identified in the holding area and taken to room 8.  The patient was then placed prone on the table and prepped and draped in the usual sterile fashion.  A  time out was called.  Ultrasound was used to evaluate the first small saphenous vein and attempted to cannulate this but the wire would not pass after anesthetizing the skin with 1% lidocaine.  I also administered fentanyl and Versed for moderate sedation and her vital signs were monitored throughout the case.  Ultimately we cannulated the popliteal vein on the left and wire did pass easily we placed a micropuncture sheath followed by a J-wire and an 8 French sheath and limited venography was performed.  We then placed a Glidewire advantage to the IVC patient was fully heparinized we then performed intravascular ultrasound from the IVC including back through the left common iliac, left external iliac, left common femoral and femoral veins and with the above findings we placed a long 16 French sheath up to the level of the femoral vein just below the common femoral vein.  We then began with percutaneous mechanical thrombectomy over the wire to include the common femoral vein, external and common iliac veins and into the IVC.  After good clearance we then performed venography which demonstrated residual thrombus in the external and common leg vein confluence in the IVC confluence and we remove the wire and performed further mechanical thrombectomy and completion venography demonstrated no residual thrombus although a defect just at the confluence of the hypogastric and external neck veins on the left.  We then performed balloon venoplasty at the left external into the common iliac veins and an IVC first with 10 mm balloon and then with 12 mm balloon using a bottle brush technique and at completion intravascular ultrasound demonstrated no residual thrombus no  residual stenosis for stenting and there was brisk flow via venography and there was no filling of collaterals.  Satisfied with this we then removed all wires and the sheath was removed and a suture bolster was placed with 0 silk suture.  Patient tolerated  procedure without immediate complication.   Contrast: 50cc  EBL: 300cc   Adeline Petitfrere C. Donzetta Matters, MD [facility] [place] Office: [phone] Pager: [phone]

## 2022-08-08 NOTE — Interval H&P Note (Signed)
History and Physical Interval Note:  08/08/2022 12:38 PM  Ashley Black  has presented today for surgery, with the diagnosis of DVT.  The various methods of treatment have been discussed with the patient and family. After consideration of risks, benefits and other options for treatment, the patient has consented to  Procedure(s): PERIPHERAL VASCULAR THROMBECTOMY (Left) as a surgical intervention.  The patient's history has been reviewed, patient examined, no change in status, stable for surgery.  I have reviewed the patient's chart and labs.  Questions were answered to the patient's satisfaction.     Servando Snare

## 2022-08-08 NOTE — Progress Notes (Signed)
PROGRESS NOTE  Ashley Black  DOB: [birth year]  PCP: Harlan Stains, MD UVO:[id number]  DOA: 08/06/2022  LOS: 2 days  Hospital Day: 3  Brief narrative: Ashley Black is a 81 y.o. female with PMH significant for dementia, chronic bradycardia, HLD. 11/25, patient woke up in the morning with left leg swelling and tingling sensation and also associated with shortness of breath.  No recent travel, surgery or fractures but spends most of the time sitting for long periods and is less active than he used to be.  In the ED, patient was in A-fib with heart rate in the 140s which apparently spontaneously resolved without intervention. Workup in the ED with ultrasound duplex of left lower extremity showed extensive occlusive DVT at the level of common femoral vein, profunda femoral vein, popliteal vein CT angio chest did not show any evidence of pulm embolism CT abdomen pelvis with contrast was also obtained which showed long segment occlusive DVT involving the left femoral veins, left internal, external and common iliac veins and the inferior confluence of the IVC Patient was started on heparin drip Admitted to [facility] Vascular surgery was consulted 11/27, underwent thrombectomy.  Of note, patient was also noted to have left facial droop in the ED and hence code stroke was called. CT head did not show any acute intracranial pathology.   CT angio head and neck did not show any emergent large vessel occlusion.  But it showed multifocal moderate stenosis of the right PCA. On chart review, it was noted that the left facial droop was not a new finding and hence code stroke was canceled.  Subjective: Patient was seen and examined this afternoon.  Pleasant elderly Caucasian female.  Lying on bed.  Not in distress.  Underwent thrombectomy today.  Daughter at bedside. Chart reviewed Remains bradycardic as low as 40s, blood pressure maintained, breathing on room air Remains on heparin drip, renal function  is stable Echo 11/26 with EF 55 to 60%  Assessment and plan: Acute LLE DVT, extensive  Presenting with left leg swelling, numbness without history of trauma, surgery or recent travel.  Patient however states she spends most of the time sitting.  DVT probably provoked by sedentary behavior.   Imaging as above without evidence of malignancy.   No evidence of PE on CT angio chest Currently on heparin drip 11/27, underwent thrombectomy by vascular surgery.    New A-fib with RVR  POA H/o chronic bradycardia  Patient has chronic bradycardia reportedly with unrevealing investigations in the past.  In the ED, she was noted to be in A-fib with RVR in the 140s which spontaneously resolved.  Currently being monitored in telemetry, most bradycardic, with heart rate as low as 40s.  At nights, it can go as low as 30s.  Patient does not have any symptoms with that.  Hypokalemia Improved with replacement Recent Labs  Lab 08/06/22 1257 08/07/22 0434 08/08/22 0339 08/08/22 1358  K 3.6 3.3* 4.3 3.4*  MG  --   --  1.9  --    AKI Creatinine was elevated up to 1.39 on admission, downtrending, 1.16 today.   Adequately hydrated. Recent Labs    08/06/22 1257 08/07/22 0434 08/08/22 0339 08/08/22 1358  BUN 30* '20 20 13  '$ CREATININE 1.39* 1.23* 1.16* 0.80    Left facial droop appears to be chronic Code stroke was called in the ED before it was known that patient facial droop was chronic.  Imagings did not show any acute findings.  HLD Continue zetia    Mild dementia Stable mental status.  Continue supportive care  Goals of care   Code Status: Full Code    Mobility: Encourage ambulation with PT tomorrow  Infusions:   sodium chloride 50 mL/hr at 08/08/22 1440   sodium chloride     heparin      Scheduled Meds:  aspirin EC  81 mg Oral Daily   ezetimibe  10 mg Oral Daily   memantine  10 mg Oral BID   mirtazapine  15 mg Oral QHS   senna-docusate  1 tablet Oral BID   sodium chloride  flush  3 mL Intravenous Q12H   sodium chloride flush  3 mL Intravenous Q12H    PRN meds: sodium chloride, acetaminophen, hydrALAZINE, hydrOXYzine, labetalol, ondansetron (ZOFRAN) IV, ondansetron **OR** [DISCONTINUED] ondansetron (ZOFRAN) IV, oxyCODONE, sodium chloride flush   Skin assessment:     Nutritional status:  Body mass index is 24.47 kg/m.          Diet:  Diet Order             Diet regular Room service appropriate? Yes; Fluid consistency: Thin  Diet effective now                   DVT prophylaxis: Heparin drip    Antimicrobials: Not on antibiotics Fluid: NS at 50 mill per hour Consultants: Vascular surgery Family Communication: Daughter at bedside  Status is: Inpatient Continue in-hospital care because: POD 0 thrombectomy.  Remains on heparin drip Level of care: Telemetry Medical   Dispo: The patient is from: Home              Anticipated d/c is to: Hopefully home tomorrow              Patient currently is not medically stable to d/c.   Difficult to place patient No       Antimicrobials: Anti-infectives (From admission, onward)    None       Objective: Vitals:   08/08/22 1400 08/08/22 1445  BP: (!) 146/74 (!) 117/59  Pulse: (!) 47 (!) 42  Resp:    Temp:  (!) 97.5 F (36.4 C)  SpO2:  100%    Intake/Output Summary (Last 24 hours) at 08/08/2022 1626 Last data filed at 08/07/2022 1814 Gross per 24 hour  Intake --  Output 600 ml  Net -600 ml   Filed Weights   08/06/22 1156 08/07/22 1842  Weight: 66.7 kg 66.7 kg   Weight change: 0.021 kg Body mass index is 24.47 kg/m.   Physical Exam: General exam: Pleasant, elderly Caucasian female.  Not in distress Skin: No rashes, lesions or ulcers. HEENT: Atraumatic, normocephalic, no obvious bleeding Lungs: Clear to auscultation bilaterally CVS: Sinus bradycardia, no murmur GI/Abd soft, nontender, nondistended, bowel sound present CNS: Alert, awake, oriented x 3 Psychiatry: Mood  appropriate Extremities: Left lower extremity has a bandage on.  Normal right lower extremity exam  Data Review: I have personally reviewed the laboratory data and studies available.  F/u labs ordered Unresulted Labs (From admission, onward)     Start     Ordered   08/09/22 0500  CBC  Tomorrow morning,   R       Question:  Specimen collection method  Answer:  Lab=Lab collect   08/08/22 1429   08/09/22 7989  Basic metabolic panel  Tomorrow morning,   R       Question:  Specimen collection method  Answer:  Lab=Lab collect  08/08/22 1429   08/09/22 0500  Lipid panel  Tomorrow morning,   R       Question:  Specimen collection method  Answer:  Lab=Lab collect   08/08/22 1518   08/09/22 0500  Heparin level (unfractionated)  Daily at 5am,   R     Question:  Specimen collection method  Answer:  Lab=Lab collect   08/08/22 1518   08/07/22 1436  Urinalysis, Routine w reflex microscopic Urine, Clean Catch  Once,   R        08/07/22 1435   08/07/22 0500  CBC  Daily at 5am,   R      08/06/22 1816            Signed, Terrilee Croak, MD [facility] 08/08/2022

## 2022-08-08 NOTE — Progress Notes (Signed)
    Subjective  -   No acutre events overnight Her leg fels a little better   Physical Exam:  Bradycardic Left leg fullness, essentially unchanged       Assessment/Plan:    Left leg DVT:  Plan for thrombectomy this afternoon.  Will need heme-onc outpatient referral for unprovoked DVT and length of anticoagulation  [facility] Agrusa 08/08/2022 7:58 AM --  Vitals:   08/07/22 1944 08/08/22 0438  BP: (!) 148/71 135/72  Pulse: (!) 55 (!) 53  Resp: 15 17  Temp: 98.1 F (36.7 C) 98.3 F (36.8 C)  SpO2: 100% 99%    Intake/Output Summary (Last 24 hours) at 08/08/2022 0758 Last data filed at 08/07/2022 1814 Gross per 24 hour  Intake --  Output 600 ml  Net -600 ml     Laboratory CBC    Component Value Date/Time   WBC 5.2 08/08/2022 0339   HGB 12.3 08/08/2022 0339   HCT 36.1 08/08/2022 0339   PLT 141 (L) 08/08/2022 0339    BMET    Component Value Date/Time   NA 139 08/08/2022 0339   K 4.3 08/08/2022 0339   CL 111 08/08/2022 0339   CO2 21 (L) 08/08/2022 0339   GLUCOSE 101 (H) 08/08/2022 0339   BUN 20 08/08/2022 0339   CREATININE 1.16 (H) 08/08/2022 0339   CALCIUM 8.6 (L) 08/08/2022 0339   GFRNONAA 47 (L) 08/08/2022 0339   GFRAA >60 04/25/2020 0905    COAG Lab Results  Component Value Date   INR 1.2 08/06/2022   No results found for: "PTT"  Antibiotics Anti-infectives (From admission, onward)    None        V. Leia Alf, M.D., [facility] [place] Office: [phone] Pager:  [phone]

## 2022-08-09 LAB — LIPID PANEL
Cholesterol: 172 mg/dL (ref 0–200)
HDL: 33 mg/dL — ABNORMAL LOW (ref >40)
LDL Cholesterol: 117 mg/dL — ABNORMAL HIGH (ref 0–99)
Total CHOL/HDL Ratio: 5.2 RATIO
Triglycerides: 110 mg/dL (ref <150)
VLDL: 22 mg/dL (ref 0–40)

## 2022-08-09 LAB — BASIC METABOLIC PANEL
Anion gap: 6 (ref 5–15)
BUN: 15 mg/dL (ref 8–23)
CO2: 22 mmol/L (ref 22–32)
Calcium: 8.3 mg/dL — ABNORMAL LOW (ref 8.9–10.3)
Chloride: 113 mmol/L — ABNORMAL HIGH (ref 98–111)
Creatinine, Ser: 1.27 mg/dL — ABNORMAL HIGH (ref 0.44–1.00)
GFR, Estimated: 42 mL/min — ABNORMAL LOW (ref >60)
Glucose, Bld: 106 mg/dL — ABNORMAL HIGH (ref 70–99)
Potassium: 3.9 mmol/L (ref 3.5–5.1)
Sodium: 141 mmol/L (ref 135–145)

## 2022-08-09 LAB — CBC
HCT: 33.1 % — ABNORMAL LOW (ref 36.0–46.0)
Hemoglobin: 10.8 g/dL — ABNORMAL LOW (ref 12.0–15.0)
MCH: 29.2 pg (ref 26.0–34.0)
MCHC: 32.6 g/dL (ref 30.0–36.0)
MCV: 89.5 fL (ref 80.0–100.0)
Platelets: 143 10*3/uL — ABNORMAL LOW (ref 150–400)
RBC: 3.7 MIL/uL — ABNORMAL LOW (ref 3.87–5.11)
RDW: 12.2 % (ref 11.5–15.5)
WBC: 4.7 10*3/uL (ref 4.0–10.5)
nRBC: 0 % (ref 0.0–0.2)

## 2022-08-09 LAB — HEPARIN LEVEL (UNFRACTIONATED): Heparin Unfractionated: 0.77 IU/mL — ABNORMAL HIGH (ref 0.30–0.70)

## 2022-08-09 MED ORDER — APIXABAN (ELIQUIS) VTE STARTER PACK (10MG AND 5MG): ORAL_TABLET | ORAL | 0 refills | Status: DC

## 2022-08-09 MED ORDER — APIXABAN 5 MG PO TABS
10.0000 mg | ORAL_TABLET | Freq: Two times a day (BID) | ORAL | Status: DC
  Administered 2022-08-09: 10 mg via ORAL
  Filled 2022-08-09: qty 2

## 2022-08-09 MED ORDER — OXYCODONE HCL 5 MG PO TABS: 5.0000 mg | ORAL_TABLET | Freq: Four times a day (QID) | ORAL | 0 refills | Status: AC | PRN

## 2022-08-09 MED ORDER — APIXABAN 5 MG PO TABS: 5.0000 mg | ORAL_TABLET | Freq: Two times a day (BID) | ORAL | Status: DC

## 2022-08-09 NOTE — Discharge Instructions (Signed)
Information on my medicine - ELIQUIS (apixaban)   Why was Eliquis prescribed for you? Eliquis was prescribed to treat blood clots that may have been found in the veins of your legs (deep vein thrombosis) or in your lungs (pulmonary embolism) and to reduce the risk of them occurring again.  What do You need to know about Eliquis ? The starting dose is 10 mg (two 5 mg tablets) taken TWICE daily for the FIRST SEVEN (7) DAYS, then on 08/16/2022  the dose is reduced to ONE 5 mg tablet taken TWICE daily.  Eliquis may be taken with or without food.   Try to take the dose about the same time in the morning and in the evening. If you have difficulty swallowing the tablet whole please discuss with your pharmacist how to take the medication safely.  Take Eliquis exactly as prescribed and DO NOT stop taking Eliquis without talking to the doctor who prescribed the medication.  Stopping may increase your risk of developing a new blood clot.  Refill your prescription before you run out.  After discharge, you should have regular check-up appointments with your healthcare provider that is prescribing your Eliquis.    What do you do if you miss a dose? If a dose of ELIQUIS is not taken at the scheduled time, take it as soon as possible on the same day and twice-daily administration should be resumed. The dose should not be doubled to make up for a missed dose.  Important Safety Information A possible side effect of Eliquis is bleeding. You should call your healthcare provider right away if you experience any of the following: Bleeding from an injury or your nose that does not stop. Unusual colored urine (red or dark brown) or unusual colored stools (red or black). Unusual bruising for unknown reasons. A serious fall or if you hit your head (even if there is no bleeding).  Some medicines may interact with Eliquis and might increase your risk of bleeding or clotting while on Eliquis. To help avoid  this, consult your healthcare provider or pharmacist prior to using any new prescription or non-prescription medications, including herbals, vitamins, non-steroidal anti-inflammatory drugs (NSAIDs) and supplements.  This website has more information on Eliquis (apixaban): http://www.eliquis.com/eliquis/home

## 2022-08-09 NOTE — Progress Notes (Addendum)
  Progress Note    08/09/2022 8:49 AM 1 Day Post-Op  Subjective:  no complaints    Vitals:   08/09/22 0502 08/09/22 0728  BP: (!) 110/59 119/68  Pulse:  (!) 50  Resp:    Temp: 98 F (36.7 C) 97.8 F (36.6 C)  SpO2:  100%    Physical Exam: Lungs:  nonlabored Incisions:  L popliteal bolster removed and small incision dressed with gauze and skin tape Extremities:  LLE dressing and bolster removed without bleeding   CBC    Component Value Date/Time   WBC 4.7 08/09/2022 0413   RBC 3.70 (L) 08/09/2022 0413   HGB 10.8 (L) 08/09/2022 0413   HCT 33.1 (L) 08/09/2022 0413   PLT 143 (L) 08/09/2022 0413   MCV 89.5 08/09/2022 0413   MCH 29.2 08/09/2022 0413   MCHC 32.6 08/09/2022 0413   RDW 12.2 08/09/2022 0413   LYMPHSABS 1.5 04/25/2020 0905   MONOABS 0.7 04/25/2020 0905   EOSABS 0.2 04/25/2020 0905   BASOSABS 0.0 04/25/2020 0905    BMET    Component Value Date/Time   NA 141 08/09/2022 0413   K 3.9 08/09/2022 0413   CL 113 (H) 08/09/2022 0413   CO2 22 08/09/2022 0413   GLUCOSE 106 (H) 08/09/2022 0413   BUN 15 08/09/2022 0413   CREATININE 1.27 (H) 08/09/2022 0413   CALCIUM 8.3 (L) 08/09/2022 0413   GFRNONAA 42 (L) 08/09/2022 0413   GFRAA >60 04/25/2020 0905    INR    Component Value Date/Time   INR 1.2 08/06/2022 1257     Intake/Output Summary (Last 24 hours) at 08/09/2022 0849 Last data filed at 08/08/2022 2118 Gross per 24 hour  Intake 781.69 ml  Output --  Net 781.69 ml      Assessment/Plan:  81 y.o. female is 1 day post op, s/p: mechanical thrombectomy of IVC, L common femoral, left external and common iliac veins with balloon angioplasty of left external and common iliac veins    -LLE swelling and sensation improved today -LLE dressing and popliteal bolster removed today. Small incision behind knee dressed with gauze and skin tape. -Will place orders for thigh high compression stockings -Can transition from heparin to PO AC today -Stable  for d/c from vascular standpoint. Will arrange f/u with our office in 4-6 weeks with repeat LLE U/S  Vicente Serene, PA-C [facility] [phone] 08/09/2022 8:49 AM  She feels much better today.  Her leg is softer. -transition to [place] -place in 20-30 thigh high compression -d/c home with 1 month follow up  Annamarie Major

## 2022-08-09 NOTE — Discharge Summary (Signed)
Physician Discharge Summary  Ashley Black RSW:[id number] DOB: 16-Apr-1941 DOA: 08/06/2022  PCP: Harlan Stains, MD  Admit date: 08/06/2022 Discharge date: 08/09/2022  Admitted From: Home Discharge disposition: Home  Recommendations at discharge:  You have been started on a blood thinner (Eliquis).  Please monitor for any evidence of bleeding or excessive bruising. Thigh-high compression bandage on the left for swelling. Follow-up with vascular surgery as an outpatient in 4 to 6 weeks   Brief narrative: Ashley Black is a 81 y.o. female with PMH significant for dementia, chronic bradycardia, HLD. 11/25, patient woke up in the morning with left leg swelling and tingling sensation and also associated with shortness of breath.  No recent travel, surgery or fractures but spends most of the time sitting for long periods and is less active than he used to be.  In the ED, patient was in A-fib with heart rate in the 140s which apparently spontaneously resolved without intervention. Workup in the ED with ultrasound duplex of left lower extremity showed extensive occlusive DVT at the level of common femoral vein, profunda femoral vein, popliteal vein CT angio chest did not show any evidence of pulm embolism CT abdomen pelvis with contrast was also obtained which showed long segment occlusive DVT involving the left femoral veins, left internal, external and common iliac veins and the inferior confluence of the IVC Patient was started on heparin drip Admitted to [facility] Vascular surgery was consulted 11/27, underwent thrombectomy.  Subjective: Patient was seen and examined this afternoon.   Sitting up in bed.  Not in distress.  Uneventful last 24 hours after thrombectomy.  Family not at bedside today.  Hospital course: Acute LLE DVT, extensive  Presenting with left leg swelling, numbness without history of trauma, surgery or recent travel.  Patient however states she spends most of the time  sitting.  DVT probably provoked by sedentary behavior.   Imaging as above without evidence of malignancy.   No evidence of PE on CT angio chest She was started on heparin drip 11/27, underwent thrombectomy by vascular surgery.  Vascular surgery follow-up appreciated. Switch to oral Eliquis today.  Discharged on Eliquis starter pack.  To follow-up with vascular surgery in 4 to 6 weeks.  This is her first episode of VTE.  However given the extent of the DVT, she may need lifelong anticoagulation unless she has a life-threatening bleeding. Thigh-high compression bandages on the left lower extremity per vascular surgery. Discussed with Dr. Trula Slade.  No need of aspirin while on Eliquis.  New A-fib with RVR  POA H/o chronic bradycardia  Patient has chronic bradycardia reportedly with unrevealing investigations in the past.  In the ED, she was noted to be in A-fib with RVR in the 140s which spontaneously resolved.  Throughout the hospitalization, she remained bradycardic with heart rate as low as 40s.  At nights, it can go as low as 30s.  Patient does not have any symptoms with that.  Hypokalemia Improved with replacement Recent Labs  Lab 08/06/22 1257 08/07/22 0434 08/08/22 0339 08/08/22 1358 08/09/22 0413  K 3.6 3.3* 4.3 3.4* 3.9  MG  --   --  1.9  --   --    AKI Creatinine gradually improving.     Left facial droop Of note, patient was also noted to have left facial droop in the ED and hence code stroke was called. CT head did not show any acute intracranial pathology.   CT angio head and neck did not show any  emergent large vessel occlusion.  But it showed multifocal moderate stenosis of the right PCA. Later, on chart review, it was noted that the left facial droop was not a new finding and hence code stroke was canceled.   HLD Continue zetia.  I would keep aspirin on hold while on anticoagulation.   Mild dementia Stable mental status.  Continue supportive care  Wounds:  -     Discharge Exam:   Vitals:   08/08/22 2124 08/09/22 0110 08/09/22 0502 08/09/22 0728  BP: (!) 149/81 (!) 152/66 (!) 110/59 119/68  Pulse: (!) 53 (!) 49  (!) 50  Resp: 16 18    Temp: 97.9 F (36.6 C) 98.3 F (36.8 C) 98 F (36.7 C) 97.8 F (36.6 C)  TempSrc: Oral Oral Oral Oral  SpO2: 100% 99%  100%  Weight:      Height:        Body mass index is 24.47 kg/m.   General exam: Pleasant, elderly Caucasian female.  Not in distress Skin: No rashes, lesions or ulcers. HEENT: Atraumatic, normocephalic, no obvious bleeding Lungs: Clear to auscultation bilaterally CVS: Sinus bradycardia, no murmur GI/Abd soft, nontender, nondistended, bowel sound present CNS: Alert, awake, oriented x 3 Psychiatry: Mood appropriate Extremities: Left lower extremity swelling is improving..  Normal right lower extremity exam  Follow ups:    Follow-up Information     Harlan Stains, MD Follow up.   Specialty: Family Medicine Contact information: [address] [place] [address] [phone]         Serafina Mitchell, MD Follow up.   Specialties: Vascular Surgery, Cardiology Contact information: [address] [place] [address] [phone]                 Discharge Instructions:   Discharge Instructions     Amb referral to [facility]   Complete by: As directed    Call MD for:  difficulty breathing, headache or visual disturbances   Complete by: As directed    Call MD for:  extreme fatigue   Complete by: As directed    Call MD for:  hives   Complete by: As directed    Call MD for:  persistant dizziness or light-headedness   Complete by: As directed    Call MD for:  persistant nausea and vomiting   Complete by: As directed    Call MD for:  severe uncontrolled pain   Complete by: As directed    Call MD for:  temperature >100.4   Complete by: As directed    Diet general   Complete by: As directed    Discharge instructions   Complete by: As  directed    Recommendations at discharge:   You have been started on a blood thinner (Eliquis).  Please monitor for any evidence of bleeding or excessive bruising.  Thigh-high compression bandage on the left for swelling.  Follow-up with vascular surgery as an outpatient in 4 to 6 weeks  General discharge instructions: Follow with Primary MD Harlan Stains, MD in 7 days  Please request your PCP  to go over your hospital tests, procedures, radiology results at the follow up. Please get your medicines reviewed and adjusted.  Your PCP may decide to repeat certain labs or tests as needed. Do not drive, operate heavy machinery, perform activities at heights, swimming or participation in water activities or provide baby sitting services if your were admitted for syncope or siezures until you have seen by Primary MD  or a Neurologist and advised to do so again. [place] Controlled Substance Reporting System database was reviewed. Do not drive, operate heavy machinery, perform activities at heights, swim, participate in water activities or provide baby-sitting services while on medications for pain, sleep and mood until your outpatient physician has reevaluated you and advised to do so again.  You are strongly recommended to comply with the dose, frequency and duration of prescribed medications. Activity: As tolerated with Full fall precautions use walker/cane & assistance as needed Avoid using any recreational substances like cigarette, tobacco, alcohol, or non-prescribed drug. If you experience worsening of your admission symptoms, develop shortness of breath, life threatening emergency, suicidal or homicidal thoughts you must seek medical attention immediately by calling 911 or calling your MD immediately  if symptoms less severe. You must read complete instructions/literature along with all the possible adverse reactions/side effects for all the medicines you take and that have been prescribed to  you. Take any new medicine only after you have completely understood and accepted all the possible adverse reactions/side effects.  Wear Seat belts while driving. You were cared for by a hospitalist during your hospital stay. If you have any questions about your discharge medications or the care you received while you were in the hospital after you are discharged, you can call the unit and ask to speak with the hospitalist or the covering physician. Once you are discharged, your primary care physician will handle any further medical issues. Please note that NO REFILLS for any discharge medications will be authorized once you are discharged, as it is imperative that you return to your primary care physician (or establish a relationship with a primary care physician if you do not have one).   Increase activity slowly   Complete by: As directed        Discharge Medications:   Allergies as of 08/09/2022   No Known Allergies      Medication List     TAKE these medications    Apixaban Starter Pack ('10mg'$  and '5mg'$ ) Commonly known as: ELIQUIS STARTER PACK Take as directed on package: start with two-'5mg'$  tablets twice daily for 7 days. On day 8, switch to one-'5mg'$  tablet twice daily.   ezetimibe 10 MG tablet Commonly known as: ZETIA Take 10 mg by mouth daily.   ICAPS AREDS 2 PO Take 1 tablet by mouth in the morning and at bedtime.   memantine 10 MG tablet Commonly known as: NAMENDA TAKE 1 TABLET BY MOUTH TWICE A DAY   mirtazapine 15 MG tablet Commonly known as: REMERON Take 15 mg by mouth at bedtime.   oxyCODONE 5 MG immediate release tablet Commonly known as: Oxy IR/ROXICODONE Take 1 tablet (5 mg total) by mouth every 6 (six) hours as needed for up to 5 days for moderate pain.         The results of significant diagnostics from this hospitalization (including imaging, microbiology, ancillary and laboratory) are listed below for reference.    Procedures and Diagnostic Studies:    ECHOCARDIOGRAM COMPLETE  Result Date: 08/07/2022    ECHOCARDIOGRAM REPORT   Patient Name:   Ashley Black Date of Exam: 08/07/2022 Medical Rec #:  [id number]        Height:       65.0 in Accession #:    [id number]       Weight:       147.0 lb Date of Birth:  [birth year]         BSA:  1.736 m Patient Age:    81 years         BP:           101/90 mmHg Patient Gender: F                HR:           51 bpm. Exam Location:  Inpatient Procedure: 2D Echo Indications:    atrial fibrillation  History:        Patient has prior history of Echocardiogram examinations, most                 recent 01/31/2011. Arrythmias:Bradycardia, Signs/Symptoms:Edema;                 Risk Factors:Dyslipidemia.  Sonographer:    Harvie Junior Referring Phys: Leonidas  Sonographer Comments: Technically difficult study due to poor echo windows and patient is obese. Image acquisition challenging due to patient body habitus. IMPRESSIONS  1. Images are limited.  2. Left ventricular ejection fraction, by estimation, is 55 to 60%. The left ventricle has normal function. The left ventricle has no regional wall motion abnormalities. Left ventricular diastolic parameters are indeterminate.  3. Right ventricular systolic function is normal. The right ventricular size is normal. Tricuspid regurgitation signal is inadequate for assessing PA pressure.  4. The mitral valve is grossly normal. Trivial mitral valve regurgitation.  5. The aortic valve is tricuspid. Aortic valve regurgitation is not visualized. Aortic valve mean gradient measures 4.0 mmHg.  6. Unable to estimate CVP. Comparison(s): Prior images unable to be directly viewed, comparison made by report only. FINDINGS  Left Ventricle: Left ventricular ejection fraction, by estimation, is 55 to 60%. The left ventricle has normal function. The left ventricle has no regional wall motion abnormalities. The left ventricular internal cavity size was normal in size. There is  no left  ventricular hypertrophy. Left ventricular diastolic parameters are indeterminate. Right Ventricle: The right ventricular size is normal. No increase in right ventricular wall thickness. Right ventricular systolic function is normal. Tricuspid regurgitation signal is inadequate for assessing PA pressure. Left Atrium: Left atrial size was normal in size. Right Atrium: Right atrial size was normal in size. Pericardium: Trivial pericardial effusion is present. The pericardial effusion is posterior to the left ventricle. Presence of epicardial fat layer. Mitral Valve: The mitral valve is grossly normal. Trivial mitral valve regurgitation. Tricuspid Valve: The tricuspid valve is grossly normal. Tricuspid valve regurgitation is mild. Aortic Valve: The aortic valve is tricuspid. There is mild aortic valve annular calcification. Aortic valve regurgitation is not visualized. Aortic valve mean gradient measures 4.0 mmHg. Aortic valve peak gradient measures 8.3 mmHg. Aortic valve area, by  VTI measures 2.35 cm. Pulmonic Valve: The pulmonic valve was grossly normal. Pulmonic valve regurgitation is mild. Aorta: The aortic root is normal in size and structure. Venous: Unable to estimate CVP. The inferior vena cava was not well visualized. IAS/Shunts: No atrial level shunt detected by color flow Doppler.  LEFT VENTRICLE PLAX 2D LVIDd:         4.40 cm     Diastology LVIDs:         2.80 cm     LV e' medial:    5.11 cm/s LV PW:         0.90 cm     LV E/e' medial:  15.6 LV IVS:        1.00 cm     LV e' lateral:   10.80 cm/s LVOT  diam:     1.90 cm     LV E/e' lateral: 7.4 LV SV:         78 LV SV Index:   45 LVOT Area:     2.84 cm  LV Volumes (MOD) LV vol d, MOD A2C: 33.6 ml LV vol d, MOD A4C: 41.5 ml LV vol s, MOD A2C: 17.4 ml LV vol s, MOD A4C: 21.6 ml LV SV MOD A2C:     16.2 ml LV SV MOD A4C:     41.5 ml LV SV MOD BP:      17.8 ml RIGHT VENTRICLE RV S prime:     13.40 cm/s TAPSE (M-mode): 1.9 cm LEFT ATRIUM           Index LA diam:       3.00 cm 1.73 cm/m LA Vol (A4C): 32.0 ml 18.44 ml/m  AORTIC VALVE                    PULMONIC VALVE AV Area (Vmax):    2.32 cm     PV Vmax:          1.03 m/s AV Area (Vmean):   2.23 cm     PV Peak grad:     4.2 mmHg AV Area (VTI):     2.35 cm     PR End Diast Vel: 2.20 msec AV Vmax:           144.00 cm/s AV Vmean:          94.100 cm/s AV VTI:            0.330 m AV Peak Grad:      8.3 mmHg AV Mean Grad:      4.0 mmHg LVOT Vmax:         118.00 cm/s LVOT Vmean:        73.900 cm/s LVOT VTI:          0.274 m LVOT/AV VTI ratio: 0.83  AORTA Ao Root diam: 2.90 cm Ao Asc diam:  3.30 cm MITRAL VALVE               TRICUSPID VALVE MV Area (PHT): 3.12 cm    TR Peak grad:   21.2 mmHg MV Decel Time: 243 msec    TR Vmax:        230.00 cm/s MR Peak grad: 28.0 mmHg MR Vmax:      264.50 cm/s  SHUNTS MV E velocity: 79.60 cm/s  Systemic VTI:  0.27 m MV A velocity: 88.30 cm/s  Systemic Diam: 1.90 cm MV E/A ratio:  0.90 Rozann Lesches MD Electronically signed by Rozann Lesches MD Signature Date/Time: 08/07/2022/4:49:34 PM    Final    CT HEAD CODE STROKE WO CONTRAST  Result Date: 08/06/2022 CLINICAL DATA:  Neuro deficit, stroke suspected. EXAM: CT ANGIOGRAPHY HEAD AND NECK TECHNIQUE: Multidetector CT imaging of the head and neck was performed using the standard protocol during bolus administration of intravenous contrast. Multiplanar CT image reconstructions and MIPs were obtained to evaluate the vascular anatomy. Carotid stenosis measurements (when applicable) are obtained utilizing NASCET criteria, using the distal internal carotid diameter as the denominator. RADIATION DOSE REDUCTION: This exam was performed according to the departmental dose-optimization program which includes automated exposure control, adjustment of the mA and/or kV according to patient size and/or use of iterative reconstruction technique. CONTRAST:  23m OMNIPAQUE IOHEXOL 350 MG/ML SOLN COMPARISON:  Brain MRI 07/08/2020, carotid Doppler 06/12/2017  FINDINGS: CT HEAD FINDINGS Brain: There  is no acute intracranial hemorrhage, extra-axial fluid collection, or acute infarct. ASPECTS is 10. There is mild background parenchymal volume loss with prominence of the ventricular system and extra-axial CSF spaces. Gray-white differentiation is preserved. There is no mass lesion.  There is no mass effect or midline shift. Vascular: See below. Skull: Normal. Negative for fracture or focal lesion. Sinuses/Orbits: The paranasal sinuses are clear. Bilateral lens implants are in place. The globes and orbits are otherwise unremarkable. Other: None. Review of the MIP images confirms the above findings CTA NECK FINDINGS Aortic arch: The imaged aortic arch is normal. The origins of the major branch vessels are patent. The subclavian arteries are patent to the level imaged. Right carotid system: The right common, internal, and external carotid arteries are patent, with mild plaque at the bifurcation but no hemodynamically significant stenosis or occlusion. There is no dissection or aneurysm. Left carotid system: The left common, internal, and external carotid arteries are patent with no significant stenosis or occlusion. There is no dissection or aneurysm. Vertebral arteries: The vertebral arteries are patent, without hemodynamically significant stenosis or occlusion. There is no dissection or aneurysm. Skeleton: There is no acute osseous abnormality or suspicious osseous lesion. There is no visible canal hematoma. Postsurgical changes reflecting C5 through C7 ACDF are noted without evidence of complication. Other neck: The soft tissues of the neck are unremarkable. Upper chest: The imaged lung apices are clear. Review of the MIP images confirms the above findings CTA HEAD FINDINGS Anterior circulation: The intracranial ICAs are patent. The bilateral MCAs are patent, without proximal stenosis or occlusion The bilateral ACAs are patent, without proximal stenosis or occlusion. The  anterior communicating artery is normal There is no aneurysm or AVM. Posterior circulation: The bilateral V4 segments are patent. The basilar artery is patent. The major cerebellar arteries are patent. The bilateral PCAs are patent. There is multifocal moderate stenosis of the right P1 and P2 segments. There is no other proximal stenosis or occlusion. Posterior communicating arteries are not definitely seen. There is no aneurysm or AVM. Venous sinuses: As permitted by contrast timing, patent. Anatomic variants: None. Review of the MIP images confirms the above findings IMPRESSION: 1. No acute intracranial pathology.  ASPECTS is 10. 2.  No emergent large vessel occlusion. 3. Multifocal moderate stenosis of the right PCA. Otherwise, no hemodynamically significant stenosis or occlusion in the head or neck. Findings communicated to Dr. Leonel Ramsay via Amion at 5 p.m. Electronically Signed   By: Valetta Mole M.D.   On: 08/06/2022 17:05   CT ANGIO HEAD NECK W WO CM (CODE STROKE)  Result Date: 08/06/2022 CLINICAL DATA:  Neuro deficit, stroke suspected. EXAM: CT ANGIOGRAPHY HEAD AND NECK TECHNIQUE: Multidetector CT imaging of the head and neck was performed using the standard protocol during bolus administration of intravenous contrast. Multiplanar CT image reconstructions and MIPs were obtained to evaluate the vascular anatomy. Carotid stenosis measurements (when applicable) are obtained utilizing NASCET criteria, using the distal internal carotid diameter as the denominator. RADIATION DOSE REDUCTION: This exam was performed according to the departmental dose-optimization program which includes automated exposure control, adjustment of the mA and/or kV according to patient size and/or use of iterative reconstruction technique. CONTRAST:  50m OMNIPAQUE IOHEXOL 350 MG/ML SOLN COMPARISON:  Brain MRI 07/08/2020, carotid Doppler 06/12/2017 FINDINGS: CT HEAD FINDINGS Brain: There is no acute intracranial hemorrhage,  extra-axial fluid collection, or acute infarct. ASPECTS is 10. There is mild background parenchymal volume loss with prominence of the ventricular system and extra-axial  CSF spaces. Gray-white differentiation is preserved. There is no mass lesion.  There is no mass effect or midline shift. Vascular: See below. Skull: Normal. Negative for fracture or focal lesion. Sinuses/Orbits: The paranasal sinuses are clear. Bilateral lens implants are in place. The globes and orbits are otherwise unremarkable. Other: None. Review of the MIP images confirms the above findings CTA NECK FINDINGS Aortic arch: The imaged aortic arch is normal. The origins of the major branch vessels are patent. The subclavian arteries are patent to the level imaged. Right carotid system: The right common, internal, and external carotid arteries are patent, with mild plaque at the bifurcation but no hemodynamically significant stenosis or occlusion. There is no dissection or aneurysm. Left carotid system: The left common, internal, and external carotid arteries are patent with no significant stenosis or occlusion. There is no dissection or aneurysm. Vertebral arteries: The vertebral arteries are patent, without hemodynamically significant stenosis or occlusion. There is no dissection or aneurysm. Skeleton: There is no acute osseous abnormality or suspicious osseous lesion. There is no visible canal hematoma. Postsurgical changes reflecting C5 through C7 ACDF are noted without evidence of complication. Other neck: The soft tissues of the neck are unremarkable. Upper chest: The imaged lung apices are clear. Review of the MIP images confirms the above findings CTA HEAD FINDINGS Anterior circulation: The intracranial ICAs are patent. The bilateral MCAs are patent, without proximal stenosis or occlusion The bilateral ACAs are patent, without proximal stenosis or occlusion. The anterior communicating artery is normal There is no aneurysm or AVM. Posterior  circulation: The bilateral V4 segments are patent. The basilar artery is patent. The major cerebellar arteries are patent. The bilateral PCAs are patent. There is multifocal moderate stenosis of the right P1 and P2 segments. There is no other proximal stenosis or occlusion. Posterior communicating arteries are not definitely seen. There is no aneurysm or AVM. Venous sinuses: As permitted by contrast timing, patent. Anatomic variants: None. Review of the MIP images confirms the above findings IMPRESSION: 1. No acute intracranial pathology.  ASPECTS is 10. 2.  No emergent large vessel occlusion. 3. Multifocal moderate stenosis of the right PCA. Otherwise, no hemodynamically significant stenosis or occlusion in the head or neck. Findings communicated to Dr. Leonel Ramsay via Amion at 5 p.m. Electronically Signed   By: Valetta Mole M.D.   On: 08/06/2022 17:05   CT ABDOMEN PELVIS W CONTRAST  Result Date: 08/06/2022 CLINICAL DATA:  Left lower extremity pain and swelling, known left lower extremity DVT EXAM: CT ABDOMEN AND PELVIS WITH CONTRAST TECHNIQUE: Multidetector CT imaging of the abdomen and pelvis was performed using the standard protocol following bolus administration of intravenous contrast. RADIATION DOSE REDUCTION: This exam was performed according to the departmental dose-optimization program which includes automated exposure control, adjustment of the mA and/or kV according to patient size and/or use of iterative reconstruction technique. CONTRAST:  19m OMNIPAQUE IOHEXOL 350 MG/ML SOLN COMPARISON:  None Available. FINDINGS: Lower chest: Please see separately reported examination of the chest. Hepatobiliary: No solid liver abnormality is seen. Gallstone in the gallbladder fundus. No gallbladder wall thickening, or biliary dilatation. Pancreas: Unremarkable. No pancreatic ductal dilatation or surrounding inflammatory changes. Spleen: Normal in size without significant abnormality. Adrenals/Urinary Tract:  Adrenal glands are unremarkable. Kidneys are normal, without renal calculi, solid lesion, or hydronephrosis. Bladder is unremarkable. Stomach/Bowel: Stomach is within normal limits. Appendix appears normal. No evidence of bowel wall thickening, distention, or inflammatory changes. Moderate burden of stool and stool balls throughout the colon and  rectum. Vascular/Lymphatic: Aortic atherosclerosis. Long segment, occlusive deep venous thrombosis involving the included left femoral veins, left internal, external, and common iliac veins, and the inferior confluence of the IVC (series 4, image 41). The right-sided pelvic veins appear patent. No enlarged abdominal or pelvic lymph nodes. Reproductive: Status post hysterectomy. Other: No abdominal wall hernia or abnormality. No ascites. Musculoskeletal: No acute or significant osseous findings. IMPRESSION: 1. Long segment, occlusive deep venous thrombosis involving the included left femoral veins, left internal, external, and common iliac veins, and the inferior confluence of the IVC. The right-sided pelvic veins appear patent. 2. Cholelithiasis. 3. Status post hysterectomy. Findings discussed by telephone with Dr. Sharlett Iles, 2:20 p.m., 08/06/2022 Aortic Atherosclerosis (ICD10-I70.0). Electronically Signed   By: Delanna Ahmadi M.D.   On: 08/06/2022 14:30   CT Angio Chest PE W and/or Wo Contrast  Result Date: 08/06/2022 CLINICAL DATA:  PE suspected, known extensive deep venous thrombosis of the left lower extremity EXAM: CT ANGIOGRAPHY CHEST WITH CONTRAST TECHNIQUE: Multidetector CT imaging of the chest was performed using the standard protocol during bolus administration of intravenous contrast. Multiplanar CT image reconstructions and MIPs were obtained to evaluate the vascular anatomy. RADIATION DOSE REDUCTION: This exam was performed according to the departmental dose-optimization program which includes automated exposure control, adjustment of the mA and/or kV  according to patient size and/or use of iterative reconstruction technique. CONTRAST:  41m OMNIPAQUE IOHEXOL 350 MG/ML SOLN COMPARISON:  None Available. FINDINGS: Cardiovascular: Satisfactory opacification of the pulmonary arteries to the segmental level. No evidence of pulmonary embolism. Heart at the upper limit of normal in size. Left coronary artery calcifications. Enlargement of the main pulmonary artery measuring up to 3.5 cm in caliber. No pericardial effusion. Scattered aortic atherosclerosis. Mediastinum/Nodes: No enlarged mediastinal, hilar, or axillary lymph nodes. Thyroid gland, trachea, and esophagus demonstrate no significant findings. Lungs/Pleura: Lungs are clear. No pleural effusion or pneumothorax. Upper Abdomen: Please see separately reported examination of the abdomen and pelvis. Musculoskeletal: No chest wall abnormality. No acute osseous findings. Review of the MIP images confirms the above findings. IMPRESSION: 1. Negative examination for pulmonary embolism. 2. Enlargement of the main pulmonary artery, as can be seen in pulmonary hypertension. 3. Coronary artery disease. Aortic Atherosclerosis (ICD10-I70.0). Electronically Signed   By: ADelanna AhmadiM.D.   On: 08/06/2022 14:22   UKoreaVenous Img Lower Unilateral Left  Result Date: 08/06/2022 CLINICAL DATA:  Left lower extremity pain and swelling. EXAM: Left LOWER EXTREMITY VENOUS DOPPLER ULTRASOUND TECHNIQUE: Gray-scale sonography with graded compression, as well as color Doppler and duplex ultrasound were performed to evaluate the lower extremity deep venous systems from the level of the common femoral vein and including the common femoral, femoral, profunda femoral, popliteal and calf veins including the posterior tibial, peroneal and gastrocnemius veins when visible. The superficial great saphenous vein was also interrogated. Spectral Doppler was utilized to evaluate flow at rest and with distal augmentation maneuvers in the common  femoral, femoral and popliteal veins. COMPARISON:  None Available. FINDINGS: Contralateral Common Femoral Vein: Respiratory phasicity is normal and symmetric with the symptomatic side. No evidence of thrombus. Normal compressibility. Common Femoral Vein: Occlusive thrombus. Saphenofemoral Junction: No evidence of thrombus. Normal compressibility and flow on color Doppler imaging. Profunda Femoral Vein: Occlusive thrombus. Femoral Vein: Occlusive thrombus. Popliteal Vein: Occlusive thrombus. Calf Veins: The posterior tibial vein is patent. No thrombus. The peroneal vein is not identified. Superficial Great Saphenous Vein: No evidence of thrombus. Normal compressibility. Venous Reflux:  None. Other Findings: Extensive subcutaneous  edema in the left lower extremity. IMPRESSION: Extensive occlusive deep venous thrombosis in the left lower extremity as detailed above. These results will be called to the ordering clinician or representative by the Radiologist Assistant, and communication documented in the PACS or [employer]. Electronically Signed   By: Marijo Sanes M.D.   On: 08/06/2022 13:33     Labs:   Basic Metabolic Panel: Recent Labs  Lab 08/06/22 1257 08/07/22 0434 08/08/22 0339 08/08/22 1358 08/09/22 0413  NA 141 141 139 150* 141  K 3.6 3.3* 4.3 3.4* 3.9  CL 111 112* 111 113* 113*  CO2 23 22 21*  --  22  GLUCOSE 93 87 101* 72 106*  BUN 30* '20 20 13 15  '$ CREATININE 1.39* 1.23* 1.16* 0.80 1.27*  CALCIUM 9.3 8.4* 8.6*  --  8.3*  MG  --   --  1.9  --   --    GFR Estimated Creatinine Clearance: 30.7 mL/min (A) (by C-G formula based on SCr of 1.27 mg/dL (H)). Liver Function Tests: No results for input(s): "AST", "ALT", "ALKPHOS", "BILITOT", "PROT", "ALBUMIN" in the last 168 hours. No results for input(s): "LIPASE", "AMYLASE" in the last 168 hours. No results for input(s): "AMMONIA" in the last 168 hours. Coagulation profile Recent Labs  Lab 08/06/22 1257  INR 1.2    CBC: Recent  Labs  Lab 08/06/22 1257 08/07/22 0434 08/08/22 0339 08/08/22 1358 08/09/22 0413  WBC 6.7 4.9 5.2  --  4.7  HGB 13.5 11.6* 12.3 8.8* 10.8*  HCT 40.6 35.8* 36.1 26.0* 33.1*  MCV 87.3 89.5 87.8  --  89.5  PLT 168 137* 141*  --  143*   Cardiac Enzymes: No results for input(s): "CKTOTAL", "CKMB", "CKMBINDEX", "TROPONINI" in the last 168 hours. BNP: Invalid input(s): "POCBNP" CBG: Recent Labs  Lab 08/06/22 1612  GLUCAP 93   D-Dimer No results for input(s): "DDIMER" in the last 72 hours. Hgb A1c No results for input(s): "HGBA1C" in the last 72 hours. Lipid Profile Recent Labs    08/09/22 0413  CHOL 172  HDL 33*  LDLCALC 117*  TRIG 110  CHOLHDL 5.2   Thyroid function studies No results for input(s): "TSH", "T4TOTAL", "T3FREE", "THYROIDAB" in the last 72 hours.  Invalid input(s): "FREET3" Anemia work up No results for input(s): "VITAMINB12", "FOLATE", "FERRITIN", "TIBC", "IRON", "RETICCTPCT" in the last 72 hours. Microbiology No results found for this or any previous visit (from the past 240 hour(s)).  Time coordinating discharge: 35 minutes  Signed: Clotilde Loth  [facility] 08/09/2022, 1:49 PM

## 2022-08-09 NOTE — Progress Notes (Signed)
ANTICOAGULATION CONSULT NOTE - Follow Up Consult  Pharmacy Consult for heparin Indication: DVT  Labs: Recent Labs    08/06/22 1257 08/06/22 1400 08/06/22 2152 08/07/22 0434 08/07/22 0630 08/07/22 1704 08/08/22 0339 08/08/22 1358 08/09/22 0413  HGB 13.5  --   --  11.6*  --   --  12.3 8.8* 10.8*  HCT 40.6  --   --  35.8*  --   --  36.1 26.0* 33.1*  PLT 168  --   --  137*  --   --  141*  --  143*  APTT 24  --   --   --   --   --   --   --   --   LABPROT 15.1  --   --   --   --   --   --   --   --   INR 1.2  --   --   --   --   --   --   --   --   HEPARINUNFRC  --   --    < >  --    < > 0.51 0.61  --  0.77*  CREATININE 1.39*  --   --  1.23*  --   --  1.16* 0.80 1.27*  TROPONINIHS  --  5  --   --   --   --   --   --   --    < > = values in this interval not displayed.    Assessment: 81yo female supratherapeutic on heparin after resumed s/p vascular thrombectomy; no infusion issues or signs of bleeding per RN.  Goal of Therapy:  Heparin level 0.3-0.7 units/ml   Plan:  Will decrease heparin infusion by 1-2 units/kg/hr to 750 units/hr and check level in 8 hours.    Ashley Black, PharmD, BCPS  08/09/2022,6:45 AM

## 2022-08-09 NOTE — Evaluation (Signed)
Physical Therapy Evaluation and Discharge Patient Details Name: Ashley Black MRN: [id number] DOB: 02-15-1941 Today's Date: 08/09/2022  History of Present Illness  Pt is 81 yo female who presents on 08/06/22 with LLE swelling. +DVT. Pt also with new Afib. Underwent thrombectomy and angioplasty on 11/27. PMH: bradycardia, HLD, depression, Alzheimers  Clinical Impression  Patient evaluated by Physical Therapy with no further acute PT needs identified. All education has been completed and the patient has no further questions. Pt from home with husband. Is performing bed mobility and transfers independently and ambulated 500' with supervision. Pt would benefit from community based exercise program, discussed this with her, but no formal PT recommendations. Mobility sufficient for d/c home today.  See below for any follow-up Physical Therapy or equipment needs. PT is signing off. Thank you for this referral.        Recommendations for follow up therapy are one component of a multi-disciplinary discharge planning process, led by the attending physician.  Recommendations may be updated based on patient status, additional functional criteria and insurance authorization.  Follow Up Recommendations No PT follow up      Assistance Recommended at Discharge PRN  Patient can return home with the following  Assistance with cooking/housework    Equipment Recommendations None recommended by PT  Recommendations for Other Services       Functional Status Assessment Patient has had a recent decline in their functional status and demonstrates the ability to make significant improvements in function in a reasonable and predictable amount of time.     Precautions / Restrictions Precautions Precautions: None Restrictions Weight Bearing Restrictions: No      Mobility  Bed Mobility Overal bed mobility: Independent             General bed mobility comments: pt has been getting in and out of bed  independently through the day today    Transfers Overall transfer level: Modified independent Equipment used: None               General transfer comment: no assist needed for transfers    Ambulation/Gait Ambulation/Gait assistance: Modified independent (Device/Increase time) Gait Distance (Feet): 500 Feet Assistive device: None Gait Pattern/deviations: Step-through pattern Gait velocity: decreased Gait velocity interpretation: 1.31 - 2.62 ft/sec, indicative of limited community ambulator   General Gait Details: decreased arm swing but and somewhat narrow BOS but no LOB and functional for limited community mobility  Stairs            Wheelchair Mobility    Modified Rankin (Stroke Patients Only)       Balance Overall balance assessment: Mild deficits observed, not formally tested                                           Pertinent Vitals/Pain Pain Assessment Pain Assessment: No/denies pain    Home Living Family/patient expects to be discharged to:: Private residence Living Arrangements: Spouse/significant other Available Help at Discharge: Family;Available 24 hours/day Type of Home: House Home Access: Stairs to enter Entrance Stairs-Rails: Left Entrance Stairs-Number of Steps: 2   Home Layout: One level Home Equipment: Conservation officer, nature (2 wheels)      Prior Function Prior Level of Function : Independent/Modified Independent                     Hand Dominance   Dominant Hand: Left  Extremity/Trunk Assessment   Upper Extremity Assessment Upper Extremity Assessment: Overall WFL for tasks assessed    Lower Extremity Assessment Lower Extremity Assessment: Overall WFL for tasks assessed    Cervical / Trunk Assessment Cervical / Trunk Assessment: Normal  Communication   Communication: No difficulties  Cognition Arousal/Alertness: Awake/alert Behavior During Therapy: WFL for tasks assessed/performed Overall  Cognitive Status: History of cognitive impairments - at baseline                                 General Comments: mild STM deficits but appropriate with basic info        General Comments General comments (skin integrity, edema, etc.): VSS, SPO2 low 90's, HR upper40's to low 60's    Exercises     Assessment/Plan    PT Assessment Patient does not need any further PT services  PT Problem List         PT Treatment Interventions      PT Goals (Current goals can be found in the Care Plan section)  Acute Rehab PT Goals Patient Stated Goal: return home today PT Goal Formulation: All assessment and education complete, DC therapy    Frequency       Co-evaluation               AM-PAC PT "6 Clicks" Mobility  Outcome Measure Help needed turning from your back to your side while in a flat bed without using bedrails?: None Help needed moving from lying on your back to sitting on the side of a flat bed without using bedrails?: None Help needed moving to and from a bed to a chair (including a wheelchair)?: None Help needed standing up from a chair using your arms (e.g., wheelchair or bedside chair)?: None Help needed to walk in hospital room?: None Help needed climbing 3-5 steps with a railing? : None 6 Click Score: 24    End of Session Equipment Utilized During Treatment: Gait belt Activity Tolerance: Patient tolerated treatment well Patient left: in chair;with call bell/phone within reach Nurse Communication: Mobility status PT Visit Diagnosis: Unsteadiness on feet (R26.81)    Time: 9163-8466 PT Time Calculation (min) (ACUTE ONLY): 30 min   Charges:   PT Evaluation $PT Eval Moderate Complexity: 1 Mod PT Treatments $Gait Training: 8-22 mins        Leighton Roach, PT  Acute Rehab Services Secure chat preferred Office [place] 08/09/2022, 4:06 PM

## 2022-08-10 ENCOUNTER — Telehealth: Payer: Self-pay | Admitting: Vascular Surgery

## 2022-08-10 NOTE — Telephone Encounter (Signed)
-----   Message from [facility], [place] sent at 08/09/2022  8:57 AM EST ----- S/p IVC and LLE mechanical thrombectomy, 11/27  Follow up with PA in 1 month with IVC and iliac vein duplex with LLE DVT study

## 2022-08-26 ENCOUNTER — Other Ambulatory Visit: Payer: Self-pay | Admitting: *Deleted

## 2022-08-26 DIAGNOSIS — I824Y2 Acute embolism and thrombosis of unspecified deep veins of left proximal lower extremity: Secondary | ICD-10-CM

## 2022-09-07 ENCOUNTER — Ambulatory Visit (HOSPITAL_COMMUNITY)
Admission: RE | Admit: 2022-09-07 | Discharge: 2022-09-07 | Disposition: A | Discharge status: Home / Self Care | Payer: Medicare Other | Source: Ambulatory Visit | Attending: Vascular Surgery | Admitting: Vascular Surgery

## 2022-09-07 ENCOUNTER — Ambulatory Visit (INDEPENDENT_AMBULATORY_CARE_PROVIDER_SITE_OTHER): Payer: Medicare Other | Admitting: Physician Assistant

## 2022-09-07 ENCOUNTER — Ambulatory Visit (INDEPENDENT_AMBULATORY_CARE_PROVIDER_SITE_OTHER)
Admission: RE | Admit: 2022-09-07 | Discharge: 2022-09-07 | Disposition: A | Discharge status: Home / Self Care | Payer: Medicare Other | Source: Ambulatory Visit | Attending: Vascular Surgery | Admitting: Vascular Surgery

## 2022-09-07 VITALS — BP 159/82 | HR 46 | Temp 97.9°F | Ht 65.0 in | Wt 157.0 lb

## 2022-09-07 DIAGNOSIS — I824Y2 Acute embolism and thrombosis of unspecified deep veins of left proximal lower extremity: Secondary | ICD-10-CM | POA: Diagnosis not present

## 2022-09-07 MED ORDER — APIXABAN 5 MG PO TABS: 5.0000 mg | ORAL_TABLET | Freq: Two times a day (BID) | ORAL | 4 refills | Status: DC

## 2022-09-07 NOTE — Progress Notes (Signed)
Office Note     CC:  follow up Requesting Provider:  Harlan Stains, MD  HPI: Ashley Black is a 81 y.o. ([birth year]) female who presents status post percutaneous mechanical thrombectomy of the left common femoral, external, and common iliac veins and IVC with balloon angioplasty of the left external and common iliac veins by Dr. Donzetta Matters on 08/08/2022.  Patient is accompanied by her daughter today.  Left leg edema has improved since surgery however edema is still present.  She admittedly has not worn her compression since leaving the hospital.  She is however elevate her leg when resting.  She is on Eliquis daily.  She denies tobacco use.   Past Medical History:  Diagnosis Date   Bradycardia    testing done 2012 no cause found   Dizziness    brief occasions   Dyslipidemia    Edema of both legs    very rare   History of hysterectomy 1985   Pneumonia    few times, none recent    Past Surgical History:  Procedure Laterality Date   ABDOMINAL HYSTERECTOMY      1 ovary removed   back disc 5-6  1997   upper back   FLEXIBLE SIGMOIDOSCOPY N/A 06/21/2016   Procedure: FLEXIBLE SIGMOIDOSCOPY;  Surgeon: Garlan Fair, MD;  Location: [facility];  Service: Endoscopy;  Laterality: N/A;   INTRAVASCULAR ULTRASOUND/IVUS Left 08/08/2022   Procedure: Intravascular Ultrasound/IVUS;  Surgeon: Waynetta Sandy, MD;  Location: [place] [facility];  Service: Cardiovascular;  Laterality: Left;   IVC VENOGRAPHY Left 08/08/2022   Procedure: IVC Venography;  Surgeon: Waynetta Sandy, MD;  Location: [place] [facility];  Service: Cardiovascular;  Laterality: Left;   LOWER EXTREMITY VENOGRAPHY Left 08/08/2022   Procedure: LOWER EXTREMITY VENOGRAPHY;  Surgeon: Waynetta Sandy, MD;  Location: [place] [facility];  Service: Cardiovascular;  Laterality: Left;   PERIPHERAL VASCULAR BALLOON ANGIOPLASTY Left 08/08/2022   Procedure: PERIPHERAL VASCULAR BALLOON ANGIOPLASTY;  Surgeon:  Waynetta Sandy, MD;  Location: [place] [facility];  Service: Cardiovascular;  Laterality: Left;   PERIPHERAL VASCULAR THROMBECTOMY Left 08/08/2022   Procedure: PERIPHERAL VASCULAR THROMBECTOMY;  Surgeon: Waynetta Sandy, MD;  Location: [place] [facility];  Service: Cardiovascular;  Laterality: Left;    Social History   Socioeconomic History   Marital status: Married    Spouse name: Not on file   Number of children: Not on file   Years of education: Not on file   Highest education level: Not on file  Occupational History   Occupation: retired    Comment: International aid/development worker  Tobacco Use   Smoking status: Former    Types: Cigarettes    Quit date: 09/13/1987    Years since quitting: 35.0   Smokeless tobacco: Never  Substance and Sexual Activity   Alcohol use: No   Drug use: No   Sexual activity: Not on file  Other Topics Concern   Not on file  Social History Narrative   Left handed    Lives with husband    Social Determinants of Health   Financial Resource Strain: Not on file  Food Insecurity: Not on file  Transportation Needs: Not on file  Physical Activity: Not on file  Stress: Not on file  Social Connections: Not on file  Intimate Partner Violence: Not on file    Family History  Problem Relation Age of Onset   Heart disease Mother     Current Outpatient Medications  Medication Sig  Dispense Refill   APIXABAN (ELIQUIS) VTE STARTER PACK ('10MG'$  AND '5MG'$ ) Take as directed on package: start with two-'5mg'$  tablets twice daily for 7 days. On day 8, switch to one-'5mg'$  tablet twice daily. 1 each 0   ezetimibe (ZETIA) 10 MG tablet Take 10 mg by mouth daily.     memantine (NAMENDA) 10 MG tablet TAKE 1 TABLET BY MOUTH TWICE A DAY 180 tablet 1   mirtazapine (REMERON) 15 MG tablet Take 15 mg by mouth at bedtime.     Multiple Vitamins-Minerals (ICAPS AREDS 2 PO) Take 1 tablet by mouth in the morning and at bedtime.     No current facility-administered medications for this  visit.    No Known Allergies   REVIEW OF SYSTEMS:   '[X]'$  denotes positive finding, '[ ]'$  denotes negative finding Cardiac  Comments:  Chest pain or chest pressure:    Shortness of breath upon exertion:    Short of breath when lying flat:    Irregular heart rhythm:        Vascular    Pain in calf, thigh, or hip brought on by ambulation:    Pain in feet at night that wakes you up from your sleep:     Blood clot in your veins:    Leg swelling:         Pulmonary    Oxygen at home:    Productive cough:     Wheezing:         Neurologic    Sudden weakness in arms or legs:     Sudden numbness in arms or legs:     Sudden onset of difficulty speaking or slurred speech:    Temporary loss of vision in one eye:     Problems with dizziness:         Gastrointestinal    Blood in stool:     Vomited blood:         Genitourinary    Burning when urinating:     Blood in urine:        Psychiatric    Major depression:         Hematologic    Bleeding problems:    Problems with blood clotting too easily:        Skin    Rashes or ulcers:        Constitutional    Fever or chills:      PHYSICAL EXAMINATION:  Vitals:   09/07/22 1016  BP: (!) 159/82  Pulse: (!) 46  Temp: 97.9 F (36.6 C)  TempSrc: Temporal  SpO2: 97%  Weight: 157 lb (71.2 kg)  Height: '5\' 5"'$  (1.651 m)    General:  WDWN in NAD; vital signs documented above Gait: Not observed HENT: WNL, normocephalic Pulmonary: normal non-labored breathing , without Rales, rhonchi,  wheezing Cardiac: regular HR Abdomen: soft, NT, no masses Skin: without rashes Vascular Exam/Pulses: Palpable L PT pulse Extremities: without ischemic changes, without Gangrene , without cellulitis; without open wounds;  Musculoskeletal: no muscle wasting or atrophy  Neurologic: A&O X 3;  No focal weakness or paresthesias are detected Psychiatric:  The pt has Normal affect.   Non-Invasive Vascular Imaging:   No evidence of thrombus in the  IVC and iliac veins No evidence of thrombus in the left lower extremity    ASSESSMENT/PLAN:: 81 y.o. female status post mechanical thrombectomy of IVC, common iliac, external iliac, and common femoral vein due to extensive DVT  -Subjectively, edema of left leg has improved  since the procedure -Duplex demonstrates widely patent IVC and left iliac venous system -Continue Eliquis daily -Encourage patient to wear at least knee-high compression to help manage swelling symptoms.  Continue elevation and avoid prolonged sitting and standing -Repeat IVC and left iliac venous scan in 6 months   Dagoberto Ligas, PA-C [facility] [phone]  Clinic MD:   Scot Dock

## 2022-09-07 NOTE — Addendum Note (Signed)
Addended byDagoberto Ligas on: 09/07/2022 12:40 PM   Modules accepted: Orders

## 2022-09-14 ENCOUNTER — Other Ambulatory Visit: Payer: Self-pay

## 2022-09-14 DIAGNOSIS — I824Y2 Acute embolism and thrombosis of unspecified deep veins of left proximal lower extremity: Secondary | ICD-10-CM

## 2022-09-23 NOTE — Telephone Encounter (Signed)
Appt has been scheduled.

## 2022-11-08 DIAGNOSIS — Z1231 Encounter for screening mammogram for malignant neoplasm of breast: Secondary | ICD-10-CM | POA: Diagnosis not present

## 2022-12-02 ENCOUNTER — Other Ambulatory Visit: Payer: Self-pay | Admitting: Physician Assistant

## 2023-02-01 ENCOUNTER — Telehealth: Payer: Self-pay | Admitting: Vascular Surgery

## 2023-02-01 ENCOUNTER — Other Ambulatory Visit: Payer: Self-pay | Admitting: Physician Assistant

## 2023-02-01 NOTE — Telephone Encounter (Signed)
LVM to schedule appt

## 2023-03-08 ENCOUNTER — Other Ambulatory Visit: Payer: Self-pay | Admitting: *Deleted

## 2023-03-08 DIAGNOSIS — I824Y2 Acute embolism and thrombosis of unspecified deep veins of left proximal lower extremity: Secondary | ICD-10-CM

## 2023-03-22 NOTE — Progress Notes (Unsigned)
HISTORY AND PHYSICAL     CC:  follow up. Requesting Provider:  Laurann Montana, MD  HPI: This is a 82 y.o. female who is here today for follow up for percutaneous mechanical thrombectomy of the left common femoral, external, and common iliac veins and IVC with balloon angioplasty of the left external and common iliac veins by Dr. Randie Heinz on 08/08/2022.   Pt was last seen 09/07/2022 and at that time, her leg edema was still present but improved.  She was not wearing her compression but was elevating her legs. She was on Eliquis.  The pt returns today for follow up.  ***  The pt is not on a statin for cholesterol management.    The pt is not on an aspirin.    Other AC:  Eliquis The pt is not on medication for hypertension.  The pt is not on medication for diabetes. Tobacco hx:  former  Pt does *** have family hx of AAA.  Past Medical History:  Diagnosis Date   Bradycardia    testing done 2012 no cause found   Dizziness    brief occasions   Dyslipidemia    Edema of both legs    very rare   History of hysterectomy 1985   Pneumonia    few times, none recent    Past Surgical History:  Procedure Laterality Date   ABDOMINAL HYSTERECTOMY      1 ovary removed   back disc 5-6  1997   upper back   CORONARY ULTRASOUND/IVUS Left 08/08/2022   Procedure: Intravascular Ultrasound/IVUS;  Surgeon: Maeola Harman, MD;  Location: [facility] [place] INVASIVE CV LAB;  Service: Cardiovascular;  Laterality: Left;   FLEXIBLE SIGMOIDOSCOPY N/A 06/21/2016   Procedure: FLEXIBLE SIGMOIDOSCOPY;  Surgeon: Charolett Bumpers, MD;  Location: [facility] ENDOSCOPY;  Service: Endoscopy;  Laterality: N/A;   IVC VENOGRAPHY Left 08/08/2022   Procedure: IVC Venography;  Surgeon: Maeola Harman, MD;  Location: [facility] INVASIVE CV LAB;  Service: Cardiovascular;  Laterality: Left;   LOWER EXTREMITY VENOGRAPHY Left 08/08/2022   Procedure: LOWER EXTREMITY VENOGRAPHY;  Surgeon: Maeola Harman, MD;  Location: [facility]  INVASIVE CV LAB;  Service: Cardiovascular;  Laterality: Left;   PERIPHERAL VASCULAR BALLOON ANGIOPLASTY Left 08/08/2022   Procedure: PERIPHERAL VASCULAR BALLOON ANGIOPLASTY;  Surgeon: Maeola Harman, MD;  Location: [facility] INVASIVE CV LAB;  Service: Cardiovascular;  Laterality: Left;   PERIPHERAL VASCULAR THROMBECTOMY Left 08/08/2022   Procedure: PERIPHERAL VASCULAR THROMBECTOMY;  Surgeon: Maeola Harman, MD;  Location: [facility] INVASIVE CV LAB;  Service: Cardiovascular;  Laterality: Left;    No Known Allergies  Current Outpatient Medications  Medication Sig Dispense Refill   apixaban (ELIQUIS) 5 MG TABS tablet TAKE 1 TABLET BY MOUTH TWICE A DAY 60 tablet 11   ezetimibe (ZETIA) 10 MG tablet Take 10 mg by mouth daily.     memantine (NAMENDA) 10 MG tablet TAKE 1 TABLET BY MOUTH TWICE A DAY 180 tablet 1   mirtazapine (REMERON) 15 MG tablet Take 15 mg by mouth at bedtime.     Multiple Vitamins-Minerals (ICAPS AREDS 2 PO) Take 1 tablet by mouth in the morning and at bedtime.     No current facility-administered medications for this visit.    Family History  Problem Relation Age of Onset   Heart disease Mother     Social History   Socioeconomic History   Marital status: Married    Spouse name: Not on file   Number of children: Not on file  Years of education: Not on file   Highest education level: Not on file  Occupational History   Occupation: retired    Comment: Chief of Staff  Tobacco Use   Smoking status: Former    Types: Cigarettes    Quit date: 09/13/1987    Years since quitting: 35.5   Smokeless tobacco: Never  Substance and Sexual Activity   Alcohol use: No   Drug use: No   Sexual activity: Not on file  Other Topics Concern   Not on file  Social History Narrative   Left handed    Lives with husband    Social Determinants of Health   Financial Resource Strain: Not on file  Food Insecurity: Not on file  Transportation Needs: Not on file  Physical  Activity: Not on file  Stress: Not on file  Social Connections: Not on file  Intimate Partner Violence: Not on file     REVIEW OF SYSTEMS:  *** [X]  denotes positive finding, [ ]  denotes negative finding Cardiac  Comments:  Chest pain or chest pressure:    Shortness of breath upon exertion:    Short of breath when lying flat:    Irregular heart rhythm:        Vascular    Pain in calf, thigh, or hip brought on by ambulation:    Pain in feet at night that wakes you up from your sleep:     Blood clot in your veins:    Leg swelling:         Pulmonary    Oxygen at home:    Productive cough:     Wheezing:         Neurologic    Sudden weakness in arms or legs:     Sudden numbness in arms or legs:     Sudden onset of difficulty speaking or slurred speech:    Temporary loss of vision in one eye:     Problems with dizziness:         Gastrointestinal    Blood in stool:     Vomited blood:         Genitourinary    Burning when urinating:     Blood in urine:        Psychiatric    Major depression:         Hematologic    Bleeding problems:    Problems with blood clotting too easily:        Skin    Rashes or ulcers:        Constitutional    Fever or chills:      PHYSICAL EXAMINATION:  ***  General:  WDWN in NAD; vital signs documented above Gait: Not observed HENT: WNL, normocephalic Pulmonary: normal non-labored breathing , without wheezing Cardiac: {Desc; regular/irreg:14544} HR, {With/Without:20273} carotid bruit*** Abdomen: soft, NT; aortic pulse is *** palpable Skin: {With/Without:20273} rashes Vascular Exam/Pulses:  Right Left  Radial {Exam; arterial pulse strength 0-4:30167} {Exam; arterial pulse strength 0-4:30167}  DP {Exam; arterial pulse strength 0-4:30167} {Exam; arterial pulse strength 0-4:30167}  PT {Exam; arterial pulse strength 0-4:30167} {Exam; arterial pulse strength 0-4:30167}   Extremities: {With/Without:20273} ischemic changes,  {With/Without:20273} Gangrene , {With/Without:20273} cellulitis; {With/Without:20273} open wounds Musculoskeletal: no muscle wasting or atrophy  Neurologic: A&O X 3 Psychiatric:  The pt has {Desc; normal/abnormal:11317::"Normal"} affect.   Non-Invasive Vascular Imaging:   Venous duplex on 03/23/2023: ***   Previous Venous duplex on 09/07/2022: LEFT:  - There is no evidence of deep vein thrombosis in  the lower extremity.  - There is no evidence of superficial venous thrombosis.     ASSESSMENT/PLAN:: 82 y.o. female with hx of percutaneous mechanical thrombectomy of the left common femoral, external, and common iliac veins and IVC with balloon angioplasty of the left external and common iliac veins by Dr. Randie Heinz on 08/08/2022.    -*** -continue *** -pt will f/u in *** with ***.   Doreatha Massed, [facility] [phone]  Clinic MD:   Randie Heinz

## 2023-03-23 ENCOUNTER — Ambulatory Visit (HOSPITAL_COMMUNITY)
Admission: RE | Admit: 2023-03-23 | Discharge: 2023-03-23 | Disposition: A | Discharge status: Home / Self Care | Payer: Medicare Other | Source: Ambulatory Visit | Attending: Vascular Surgery | Admitting: Vascular Surgery

## 2023-03-23 ENCOUNTER — Ambulatory Visit: Payer: Medicare Other | Admitting: Physician Assistant

## 2023-03-23 VITALS — BP 113/68 | HR 46 | Temp 98.3°F | Resp 16 | Ht 65.0 in | Wt 145.0 lb

## 2023-03-23 DIAGNOSIS — I824Y2 Acute embolism and thrombosis of unspecified deep veins of left proximal lower extremity: Secondary | ICD-10-CM | POA: Diagnosis not present

## 2023-03-23 DIAGNOSIS — Z86718 Personal history of other venous thrombosis and embolism: Secondary | ICD-10-CM | POA: Diagnosis not present

## 2023-03-30 DIAGNOSIS — Z0000 Encounter for general adult medical examination without abnormal findings: Secondary | ICD-10-CM | POA: Diagnosis not present

## 2023-03-30 DIAGNOSIS — G309 Alzheimer's disease, unspecified: Secondary | ICD-10-CM | POA: Diagnosis not present

## 2023-03-30 DIAGNOSIS — Z86718 Personal history of other venous thrombosis and embolism: Secondary | ICD-10-CM | POA: Diagnosis not present

## 2023-03-30 DIAGNOSIS — E538 Deficiency of other specified B group vitamins: Secondary | ICD-10-CM | POA: Diagnosis not present

## 2023-03-30 DIAGNOSIS — E785 Hyperlipidemia, unspecified: Secondary | ICD-10-CM | POA: Diagnosis not present

## 2023-03-30 DIAGNOSIS — N1831 Chronic kidney disease, stage 3a: Secondary | ICD-10-CM | POA: Diagnosis not present

## 2023-03-30 DIAGNOSIS — Z7901 Long term (current) use of anticoagulants: Secondary | ICD-10-CM | POA: Diagnosis not present

## 2023-03-30 DIAGNOSIS — I480 Paroxysmal atrial fibrillation: Secondary | ICD-10-CM | POA: Diagnosis not present

## 2023-03-30 DIAGNOSIS — E559 Vitamin D deficiency, unspecified: Secondary | ICD-10-CM | POA: Diagnosis not present

## 2023-04-04 ENCOUNTER — Telehealth: Payer: Self-pay | Admitting: *Deleted

## 2023-04-04 ENCOUNTER — Other Ambulatory Visit: Payer: Self-pay | Admitting: *Deleted

## 2023-04-04 DIAGNOSIS — I480 Paroxysmal atrial fibrillation: Secondary | ICD-10-CM

## 2023-04-04 NOTE — Telephone Encounter (Signed)
LMVM to call and schedule appointment to have first of 2 consecutive 14 day ZIO AT monitors applied, (2nd monitor to be given to patient at appointment).

## 2023-04-05 ENCOUNTER — Telehealth: Payer: Self-pay | Admitting: *Deleted

## 2023-04-05 NOTE — Telephone Encounter (Signed)
Dr. Laurann Montana has ordered a heart monitor for you. Please call Aquila Menzie in monitor department at [phone] to schedule an appointment to have 28 day ZIO AT monitor applied at our office.

## 2023-04-06 ENCOUNTER — Other Ambulatory Visit (INDEPENDENT_AMBULATORY_CARE_PROVIDER_SITE_OTHER): Payer: Medicare Other

## 2023-04-06 ENCOUNTER — Telehealth: Payer: Self-pay | Admitting: *Deleted

## 2023-04-06 DIAGNOSIS — I480 Paroxysmal atrial fibrillation: Secondary | ICD-10-CM

## 2023-04-06 NOTE — Telephone Encounter (Signed)
Will have two 14 day ZIO AT monitors sent UPS to the patients home. Explained to daughter , Aggie Cosier , process of 2 consecutive 14 day monitors. Instructions to be sent via MyChart.

## 2023-04-06 NOTE — Progress Notes (Unsigned)
Two consecutive 14 day ZIO AT monitors to be shipped to patients address.  Second monitor scheduled to be delivered 04/20/23. Reviewed instructions with daughter, Aggie Cosier and also sent instructions via My Chart.  DOD to read.

## 2023-04-13 DIAGNOSIS — I480 Paroxysmal atrial fibrillation: Secondary | ICD-10-CM | POA: Diagnosis not present

## 2023-04-14 DIAGNOSIS — I480 Paroxysmal atrial fibrillation: Secondary | ICD-10-CM | POA: Diagnosis not present

## 2023-04-25 DIAGNOSIS — D1801 Hemangioma of skin and subcutaneous tissue: Secondary | ICD-10-CM | POA: Diagnosis not present

## 2023-04-25 DIAGNOSIS — L570 Actinic keratosis: Secondary | ICD-10-CM | POA: Diagnosis not present

## 2023-04-25 DIAGNOSIS — Z85828 Personal history of other malignant neoplasm of skin: Secondary | ICD-10-CM | POA: Diagnosis not present

## 2023-04-25 DIAGNOSIS — D692 Other nonthrombocytopenic purpura: Secondary | ICD-10-CM | POA: Diagnosis not present

## 2023-04-25 DIAGNOSIS — L821 Other seborrheic keratosis: Secondary | ICD-10-CM | POA: Diagnosis not present

## 2023-04-25 DIAGNOSIS — D225 Melanocytic nevi of trunk: Secondary | ICD-10-CM | POA: Diagnosis not present

## 2023-04-25 DIAGNOSIS — L814 Other melanin hyperpigmentation: Secondary | ICD-10-CM | POA: Diagnosis not present

## 2023-04-25 DIAGNOSIS — D2272 Melanocytic nevi of left lower limb, including hip: Secondary | ICD-10-CM | POA: Diagnosis not present

## 2023-04-25 DIAGNOSIS — D235 Other benign neoplasm of skin of trunk: Secondary | ICD-10-CM | POA: Diagnosis not present

## 2023-05-26 ENCOUNTER — Other Ambulatory Visit: Payer: Self-pay | Admitting: Physician Assistant

## 2023-09-18 DIAGNOSIS — M7989 Other specified soft tissue disorders: Secondary | ICD-10-CM | POA: Diagnosis not present

## 2023-09-18 DIAGNOSIS — I480 Paroxysmal atrial fibrillation: Secondary | ICD-10-CM | POA: Diagnosis not present

## 2023-09-18 DIAGNOSIS — G301 Alzheimer's disease with late onset: Secondary | ICD-10-CM | POA: Diagnosis not present

## 2023-09-18 DIAGNOSIS — Z86718 Personal history of other venous thrombosis and embolism: Secondary | ICD-10-CM | POA: Diagnosis not present

## 2023-09-18 DIAGNOSIS — N1831 Chronic kidney disease, stage 3a: Secondary | ICD-10-CM | POA: Diagnosis not present

## 2023-09-18 DIAGNOSIS — R001 Bradycardia, unspecified: Secondary | ICD-10-CM | POA: Diagnosis not present

## 2023-09-20 ENCOUNTER — Telehealth: Payer: Self-pay

## 2023-09-20 NOTE — Telephone Encounter (Signed)
 Triage:  Daughter Ashley Black called stating her mom had swelling in her LLE like she had when she had a DVT in her leg.  She requested an appointment. She states now that her leg is better and she will just call us back if it gets bad again.

## 2023-09-25 DIAGNOSIS — H53143 Visual discomfort, bilateral: Secondary | ICD-10-CM | POA: Diagnosis not present

## 2023-11-25 ENCOUNTER — Other Ambulatory Visit: Payer: Self-pay | Admitting: Physician Assistant

## 2023-11-27 ENCOUNTER — Ambulatory Visit: Payer: Medicare Other | Admitting: Neurology

## 2023-11-27 ENCOUNTER — Encounter: Payer: Self-pay | Admitting: Neurology

## 2023-11-27 VITALS — BP 115/71 | HR 53 | Ht 65.0 in | Wt 152.6 lb

## 2023-11-27 DIAGNOSIS — G301 Alzheimer's disease with late onset: Secondary | ICD-10-CM | POA: Diagnosis not present

## 2023-11-27 DIAGNOSIS — H53462 Homonymous bilateral field defects, left side: Secondary | ICD-10-CM

## 2023-11-27 DIAGNOSIS — F0280 Dementia in other diseases classified elsewhere without behavioral disturbance: Secondary | ICD-10-CM

## 2023-11-27 MED ORDER — MEMANTINE HCL 10 MG PO TABS: 10.0000 mg | ORAL_TABLET | Freq: Two times a day (BID) | ORAL | 4 refills | Status: AC
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
## 2023-11-27 NOTE — Patient Instructions (Signed)
 Good to see you.  Schedule MRI brain without contrast  2. Continue Memantine 10mg  twice a day  3. Continue close supervision  4. Follow-up in 1 year or earlier if needed, call for any changes   FALL PRECAUTIONS: Be cautious when walking. Scan the area for obstacles that may increase the risk of trips and falls. When getting up in the mornings, sit up at the edge of the bed for a few minutes before getting out of bed. Consider elevating the bed at the head end to avoid drop of blood pressure when getting up. Walk always in a well-lit room (use night lights in the walls). Avoid area rugs or power cords from appliances in the middle of the walkways. Use a walker or a cane if necessary and consider physical therapy for balance exercise. Get your eyesight checked regularly.  HOME SAFETY: Consider the safety of the kitchen when operating appliances like stoves, microwave oven, and blender. Consider having supervision and share cooking responsibilities until no longer able to participate in those. Accidents with firearms and other hazards in the house should be identified and addressed as well.  ABILITY TO BE LEFT ALONE: If patient is unable to contact 911 operator, consider using LifeLine, or when the need is there, arrange for someone to stay with patients. Smoking is a fire hazard, consider supervision or cessation. Risk of wandering should be assessed by caregiver and if detected at any point, supervision and safe proof recommendations should be instituted.   RECOMMENDATIONS FOR ALL PATIENTS WITH MEMORY PROBLEMS: 1. Continue to exercise (Recommend 30 minutes of walking everyday, or 3 hours every week) 2. Increase social interactions - continue going to [place] and enjoy social gatherings with friends and family 3. Eat healthy, avoid fried foods and eat more fruits and vegetables 4. Maintain adequate blood pressure, blood sugar, and blood cholesterol level. Reducing the risk of stroke and  cardiovascular disease also helps promoting better memory. 5. Avoid stressful situations. Live a simple life and avoid aggravations. Organize your time and prepare for the next day in anticipation. 6. Sleep well, avoid any interruptions of sleep and avoid any distractions in the bedroom that may interfere with adequate sleep quality 7. Avoid sugar, avoid sweets as there is a strong link between excessive sugar intake, diabetes, and cognitive impairment We discussed the Mediterranean diet, which has been shown to help patients reduce the risk of progressive memory disorders and reduces cardiovascular risk. This includes eating fish, eat fruits and green leafy vegetables, nuts like almonds and hazelnuts, walnuts, and also use olive oil. Avoid fast foods and fried foods as much as possible. Avoid sweets and sugar as sugar use has been linked to worsening of memory function.  There is always a concern of gradual progression of memory problems. If this is the case, then we may need to adjust level of care according to patient needs. Support, both to the patient and caregiver, should then be put into place.

## 2024-02-13 ENCOUNTER — Other Ambulatory Visit

## 2024-02-13 ENCOUNTER — Ambulatory Visit
Admission: RE | Admit: 2024-02-13 | Discharge: 2024-02-13 | Disposition: A | Discharge status: Home / Self Care | Source: Ambulatory Visit | Attending: Neurology

## 2024-02-13 DIAGNOSIS — H53462 Homonymous bilateral field defects, left side: Secondary | ICD-10-CM

## 2024-02-13 DIAGNOSIS — I6782 Cerebral ischemia: Secondary | ICD-10-CM | POA: Diagnosis not present

## 2024-02-13 DIAGNOSIS — G319 Degenerative disease of nervous system, unspecified: Secondary | ICD-10-CM | POA: Diagnosis not present

## 2024-02-20 DIAGNOSIS — H40053 Ocular hypertension, bilateral: Secondary | ICD-10-CM | POA: Diagnosis not present

## 2024-02-20 DIAGNOSIS — H40013 Open angle with borderline findings, low risk, bilateral: Secondary | ICD-10-CM | POA: Diagnosis not present

## 2024-03-11 ENCOUNTER — Ambulatory Visit: Payer: Self-pay | Admitting: Neurology

## 2024-03-13 DIAGNOSIS — G301 Alzheimer's disease with late onset: Secondary | ICD-10-CM | POA: Diagnosis not present

## 2024-03-13 DIAGNOSIS — Z23 Encounter for immunization: Secondary | ICD-10-CM | POA: Diagnosis not present

## 2024-03-13 DIAGNOSIS — Z86718 Personal history of other venous thrombosis and embolism: Secondary | ICD-10-CM | POA: Diagnosis not present

## 2024-03-13 DIAGNOSIS — I480 Paroxysmal atrial fibrillation: Secondary | ICD-10-CM | POA: Diagnosis not present

## 2024-03-13 DIAGNOSIS — E785 Hyperlipidemia, unspecified: Secondary | ICD-10-CM | POA: Diagnosis not present

## 2024-03-13 DIAGNOSIS — E538 Deficiency of other specified B group vitamins: Secondary | ICD-10-CM | POA: Diagnosis not present

## 2024-03-13 DIAGNOSIS — N1831 Chronic kidney disease, stage 3a: Secondary | ICD-10-CM | POA: Diagnosis not present

## 2024-03-13 DIAGNOSIS — Z7901 Long term (current) use of anticoagulants: Secondary | ICD-10-CM | POA: Diagnosis not present

## 2024-03-13 DIAGNOSIS — Z0000 Encounter for general adult medical examination without abnormal findings: Secondary | ICD-10-CM | POA: Diagnosis not present

## 2024-03-13 DIAGNOSIS — E559 Vitamin D deficiency, unspecified: Secondary | ICD-10-CM | POA: Diagnosis not present

## 2024-03-20 NOTE — Telephone Encounter (Signed)
 Pt called per DPR left a voice mail brain MRI did not show any new changes, no tumor, stroke, or bleed. No change from brain scan done in 2021

## 2024-03-20 NOTE — Telephone Encounter (Signed)
-----   Message from Darice CHRISTELLA Shivers sent at 03/11/2024 12:04 PM EDT ----- Pls let her know brain MRI did not show any new changes, no tumor, stroke, or bleed. No change from brain scan done in 2021. Thanks ----- Message ----- From: Interface, Rad Results In Sent: 03/09/2024  12:42 PM EDT To: Darice CHRISTELLA Shivers, MD

## 2024-07-01 NOTE — Progress Notes (Unsigned)
 No chief complaint on file.  History of Present Illness: 83 yo female with history of bradycardia and HLD who is here today as a new consult, referred by Dr. Teresa, for the evaluation of *** atrial fibrillation.   Primary Care Physician: Teresa Channel, MD   Past Medical History:  Diagnosis Date   Bradycardia    testing done 2012 no cause found   Dizziness    brief occasions   Dyslipidemia    Edema of both legs    very rare   History of hysterectomy 1985   Pneumonia    few times, none recent    Past Surgical History:  Procedure Laterality Date   ABDOMINAL HYSTERECTOMY      1 ovary removed   back disc 5-6  1997   upper back   CORONARY ULTRASOUND/IVUS Left 08/08/2022   Procedure: Intravascular Ultrasound/IVUS;  Surgeon: Sheree Penne Bruckner, MD;  Location: [facility] INVASIVE CV LAB;  Service: Cardiovascular;  Laterality: Left;   FLEXIBLE SIGMOIDOSCOPY N/A 06/21/2016   Procedure: FLEXIBLE SIGMOIDOSCOPY;  Surgeon: Gladis MARLA Louder, MD;  Location: [facility] ENDOSCOPY;  Service: Endoscopy;  Laterality: N/A;   IVC VENOGRAPHY Left 08/08/2022   Procedure: IVC Venography;  Surgeon: Sheree Penne Bruckner, MD;  Location: [facility] INVASIVE CV LAB;  Service: Cardiovascular;  Laterality: Left;   LOWER EXTREMITY VENOGRAPHY Left 08/08/2022   Procedure: LOWER EXTREMITY VENOGRAPHY;  Surgeon: Sheree Penne Bruckner, MD;  Location: [facility] INVASIVE CV LAB;  Service: Cardiovascular;  Laterality: Left;   PERIPHERAL VASCULAR BALLOON ANGIOPLASTY Left 08/08/2022   Procedure: PERIPHERAL VASCULAR BALLOON ANGIOPLASTY;  Surgeon: Sheree Penne Bruckner, MD;  Location: [facility] INVASIVE CV LAB;  Service: Cardiovascular;  Laterality: Left;   PERIPHERAL VASCULAR THROMBECTOMY Left 08/08/2022   Procedure: PERIPHERAL VASCULAR THROMBECTOMY;  Surgeon: Sheree Penne Bruckner, MD;  Location: [facility] INVASIVE CV LAB;  Service: Cardiovascular;  Laterality: Left;    Current Outpatient Medications  Medication Sig Dispense Refill    aspirin  EC 81 MG tablet Take 81 mg by mouth daily. Swallow whole.     ezetimibe  (ZETIA ) 10 MG tablet Take 10 mg by mouth daily.     memantine  (NAMENDA ) 10 MG tablet Take 1 tablet (10 mg total) by mouth 2 (two) times daily. 180 tablet 4   mirtazapine  (REMERON ) 15 MG tablet Take 15 mg by mouth at bedtime.     Multiple Vitamins-Minerals (ICAPS AREDS 2 PO) Take 1 tablet by mouth in the morning and at bedtime. (Patient not taking: Reported on 11/27/2023)     sertraline (ZOLOFT) 50 MG tablet Take 50 mg by mouth daily.     No current facility-administered medications for this visit.    No Known Allergies  Social History   Socioeconomic History   Marital status: Married    Spouse name: Not on file   Number of children: Not on file   Years of education: Not on file   Highest education level: Not on file  Occupational History   Occupation: retired    Comment: Chief of Staff  Tobacco Use   Smoking status: Former    Current packs/day: 0.00    Types: Cigarettes    Quit date: 09/13/1987    Years since quitting: 36.8   Smokeless tobacco: Never  Vaping Use   Vaping status: Never Used  Substance and Sexual Activity   Alcohol use: No   Drug use: No   Sexual activity: Not on file  Other Topics Concern   Not on file  Social History Narrative   Left handed  Lives with husband    Social Drivers of Corporate investment banker Strain: Not on file  Food Insecurity: Not on file  Transportation Needs: Not on file  Physical Activity: Not on file  Stress: Not on file  Social Connections: Not on file  Intimate Partner Violence: Not on file    Family History  Problem Relation Age of Onset   Heart disease Mother     Review of Systems:  As stated in the HPI and otherwise negative.   There were no vitals taken for this visit.  Physical Examination: General: Well developed, well nourished, NAD  HEENT: OP clear, mucus membranes moist  SKIN: warm, dry. No rashes. Neuro: No focal deficits   Musculoskeletal: Muscle strength 5/5 all ext  Psychiatric: Mood and affect normal  Neck: No JVD, no carotid bruits, no thyromegaly, no lymphadenopathy.  Lungs:Clear bilaterally, no wheezes, rhonci, crackles Cardiovascular: Regular rate and rhythm. No murmurs, gallops or rubs. Abdomen:Soft. Bowel sounds present. Non-tender.  Extremities: No lower extremity edema. Pulses are 2 + in the bilateral DP/PT.  EKG:  EKG {ACTION; IS/IS [id number]:[id number]} ordered today. The ekg ordered today demonstrates ***  Recent Labs: No results found for requested labs within last 365 days.   Lipid Panel    Component Value Date/Time   CHOL 172 08/09/2022 0413   TRIG 110 08/09/2022 0413   HDL 33 (L) 08/09/2022 0413   CHOLHDL 5.2 08/09/2022 0413   VLDL 22 08/09/2022 0413   LDLCALC 117 (H) 08/09/2022 0413     Wt Readings from Last 3 Encounters:  11/27/23 152 lb 9.6 oz (69.2 kg)  03/23/23 145 lb (65.8 kg)  09/07/22 157 lb (71.2 kg)      Assessment and Plan:   1.   Labs/ tests ordered today include:  No orders of the defined types were placed in this encounter.    Disposition:   F/U with me in ***    Signed, Lonni Cash, MD, [facility] 07/01/2024 9:14 PM    [facility] [address] [place], [place], [place]  [address] Phone: [phone]; Fax: [phone]

## 2024-07-02 ENCOUNTER — Ambulatory Visit: Attending: Cardiovascular Disease | Admitting: Cardiovascular Disease

## 2024-07-02 ENCOUNTER — Encounter: Payer: Self-pay | Admitting: Cardiovascular Disease

## 2024-07-02 VITALS — BP 110/72 | HR 74 | Ht 65.0 in | Wt 150.0 lb

## 2024-07-02 DIAGNOSIS — I480 Paroxysmal atrial fibrillation: Secondary | ICD-10-CM

## 2024-07-02 DIAGNOSIS — I471 Supraventricular tachycardia, unspecified: Secondary | ICD-10-CM

## 2024-07-02 NOTE — Patient Instructions (Signed)

## 2024-07-25 ENCOUNTER — Encounter: Payer: Self-pay | Admitting: Neurology

## 2024-11-26 ENCOUNTER — Ambulatory Visit: Admitting: Neurology
